# Patient Record
Sex: Male | Born: 1960 | Race: White | Hispanic: No | Marital: Married | Smoking: Never smoker
Health system: Southern US, Community
[De-identification: ages and names within clinical notes are randomized; demographics above are authoritative.]

## PROBLEM LIST (undated history)

## (undated) DIAGNOSIS — T4145XA Adverse effect of unspecified anesthetic, initial encounter: Secondary | ICD-10-CM

## (undated) DIAGNOSIS — Z923 Personal history of irradiation: Secondary | ICD-10-CM

## (undated) DIAGNOSIS — T8859XA Other complications of anesthesia, initial encounter: Secondary | ICD-10-CM

## (undated) DIAGNOSIS — S40029A Contusion of unspecified upper arm, initial encounter: Secondary | ICD-10-CM

## (undated) DIAGNOSIS — C801 Malignant (primary) neoplasm, unspecified: Secondary | ICD-10-CM

## (undated) HISTORY — PX: TONSILLECTOMY: SUR1361

## (undated) HISTORY — PX: ANKLE SURGERY: SHX546

## (undated) HISTORY — PX: KNEE ARTHROSCOPY W/ ACL RECONSTRUCTION: SHX1858

## (undated) HISTORY — DX: Personal history of irradiation: Z92.3

## (undated) HISTORY — PX: HERNIA REPAIR: SHX51

---

## 2015-07-22 ENCOUNTER — Ambulatory Visit
Admission: RE | Admit: 2015-07-22 | Discharge: 2015-07-22 | Disposition: A | Discharge status: Home / Self Care | Payer: PRIVATE HEALTH INSURANCE | Source: Ambulatory Visit | Attending: Surgery | Admitting: Surgery

## 2015-07-22 ENCOUNTER — Other Ambulatory Visit: Payer: Self-pay | Admitting: Surgery

## 2015-07-22 DIAGNOSIS — R222 Localized swelling, mass and lump, trunk: Secondary | ICD-10-CM

## 2015-07-22 MED ORDER — IOPAMIDOL (ISOVUE-300) INJECTION 61%
75.0000 mL | Freq: Once | INTRAVENOUS | Status: AC | PRN
  Administered 2015-07-22: 75 mL via INTRAVENOUS

## 2015-07-25 ENCOUNTER — Other Ambulatory Visit: Payer: Self-pay | Admitting: Surgery

## 2015-07-25 DIAGNOSIS — R590 Localized enlarged lymph nodes: Secondary | ICD-10-CM

## 2015-07-27 ENCOUNTER — Other Ambulatory Visit: Payer: Self-pay | Admitting: Surgery

## 2015-07-27 DIAGNOSIS — N63 Unspecified lump in unspecified breast: Secondary | ICD-10-CM

## 2015-07-28 ENCOUNTER — Other Ambulatory Visit: Payer: Self-pay | Admitting: Radiology

## 2015-07-29 ENCOUNTER — Ambulatory Visit (HOSPITAL_COMMUNITY)
Admission: RE | Admit: 2015-07-29 | Discharge: 2015-07-29 | Disposition: A | Discharge status: Home / Self Care | Payer: PRIVATE HEALTH INSURANCE | Source: Ambulatory Visit | Attending: Surgery | Admitting: Surgery

## 2015-07-29 ENCOUNTER — Encounter (HOSPITAL_COMMUNITY): Payer: Self-pay

## 2015-07-29 DIAGNOSIS — R590 Localized enlarged lymph nodes: Secondary | ICD-10-CM | POA: Insufficient documentation

## 2015-07-29 LAB — CBC
HEMATOCRIT: 43.3 % (ref 39.0–52.0)
Hemoglobin: 14.9 g/dL (ref 13.0–17.0)
MCH: 29.4 pg (ref 26.0–34.0)
MCHC: 34.4 g/dL (ref 30.0–36.0)
MCV: 85.4 fL (ref 78.0–100.0)
Platelets: 170 10*3/uL (ref 150–400)
RBC: 5.07 MIL/uL (ref 4.22–5.81)
RDW: 13 % (ref 11.5–15.5)
WBC: 5.6 10*3/uL (ref 4.0–10.5)

## 2015-07-29 LAB — APTT: aPTT: 27 seconds (ref 24–37)

## 2015-07-29 LAB — PROTIME-INR
INR: 1.12 (ref 0.00–1.49)
Prothrombin Time: 14.2 seconds (ref 11.6–15.2)

## 2015-07-29 MED ORDER — MIDAZOLAM HCL 2 MG/2ML IJ SOLN
INTRAMUSCULAR | Status: AC | PRN
  Administered 2015-07-29 (×4): 1 mg via INTRAVENOUS

## 2015-07-29 MED ORDER — MIDAZOLAM HCL 5 MG/5ML IJ SOLN
INTRAMUSCULAR | Status: AC | PRN
  Administered 2015-07-29: 1 mg via INTRAVENOUS

## 2015-07-29 MED ORDER — FENTANYL CITRATE (PF) 100 MCG/2ML IJ SOLN
INTRAMUSCULAR | Status: AC | PRN
  Administered 2015-07-29: 25 ug via INTRAVENOUS
  Administered 2015-07-29: 50 ug via INTRAVENOUS

## 2015-07-29 MED ORDER — MIDAZOLAM HCL 2 MG/2ML IJ SOLN
INTRAMUSCULAR | Status: AC
  Filled 2015-07-29: qty 6

## 2015-07-29 MED ORDER — SODIUM CHLORIDE 0.9 % IV SOLN
INTRAVENOUS | Status: DC
  Administered 2015-07-29: 09:00:00 via INTRAVENOUS

## 2015-07-29 MED ORDER — FENTANYL CITRATE (PF) 100 MCG/2ML IJ SOLN
INTRAMUSCULAR | Status: AC
  Filled 2015-07-29: qty 4

## 2015-07-29 NOTE — Procedures (Signed)
Technically successful US guided biopsy of dominant right axillary lymph node.   EBL: Minimal   No immediate complications.   Ronny Bacon, MD Pager #: [phone]

## 2015-07-29 NOTE — Discharge Instructions (Signed)
Needle Biopsy, Care After °These instructions give you information about caring for yourself after your procedure. Your doctor may also give you more specific instructions. Call your doctor if you have any problems or questions after your procedure. °HOME CARE °· Rest as told by your doctor. °· Take medicines only as told by your doctor. °· There are many different ways to close and cover the biopsy site, including stitches (sutures), skin glue, and adhesive strips. Follow instructions from your doctor about: °· How to take care of your biopsy site. °· When and how you should change your bandage (dressing). °· When you should remove your dressing. °· Removing whatever was used to close your biopsy site. °· Check your biopsy site every day for signs of infection. Watch for: °· Redness, swelling, or pain. °· Fluid, blood, or pus. °GET HELP IF: °· You have a fever. °· You have redness, swelling, or pain at the biopsy site, and it lasts longer than a few days. °· You have fluid, blood, or pus coming from the biopsy site. °· You feel sick to your stomach (nauseous). °· You throw up (vomit). °GET HELP RIGHT AWAY IF: °· You are short of breath. °· You have trouble breathing. °· Your chest hurts. °· You feel dizzy or you pass out (faint). °· You have bleeding that does not stop with pressure or a bandage. °· You cough up blood. °· Your belly (abdomen) hurts. °  °This information is not intended to replace advice given to you by your health care provider. Make sure you discuss any questions you have with your health care provider. °  °Document Released: 04/19/2008 Document Revised: 09/21/2014 Document Reviewed: 05/03/2014 °Elsevier Interactive Patient Education ©2016 [employer] °Moderate Conscious Sedation, Adult °Sedation is the use of medicines to promote relaxation and relieve discomfort and anxiety. Moderate conscious sedation is a type of sedation. Under moderate conscious sedation you are less alert than normal but  are still able to respond to instructions or stimulation. Moderate conscious sedation is used during short medical and dental procedures. It is milder than deep sedation or general anesthesia and allows you to return to your regular activities sooner. °LET YOUR HEALTH CARE PROVIDER KNOW ABOUT:  °· Any allergies you have. °· All medicines you are taking, including vitamins, herbs, eye drops, creams, and over-the-counter medicines. °· Use of steroids (by mouth or creams). °· Previous problems you or members of your family have had with the use of anesthetics. °· Any blood disorders you have. °· Previous surgeries you have had. °· Medical conditions you have. °· Possibility of pregnancy, if this applies. °· Use of cigarettes, alcohol, or illegal drugs. °RISKS AND COMPLICATIONS °Generally, this is a safe procedure. However, as with any procedure, problems can occur. Possible problems include: °· Oversedation. °· Trouble breathing on your own. You may need to have a breathing tube until you are awake and breathing on your own. °· Allergic reaction to any of the medicines used for the procedure. °BEFORE THE PROCEDURE °· You may have blood tests done. These tests can help show how well your kidneys and liver are working. They can also show how well your blood clots. °· A physical exam will be done.   °· Only take medicines as directed by your health care provider. You may need to stop taking medicines (such as blood thinners, aspirin, or nonsteroidal anti-inflammatory drugs) before the procedure.   °· Do not eat or drink at least 6 hours before the procedure or as directed by   your health care provider. °· Arrange for a responsible adult, family member, or friend to take you home after the procedure. He or she should stay with you for at least 24 hours after the procedure, until the medicine has worn off. °PROCEDURE  °· An intravenous (IV) catheter will be inserted into one of your veins. Medicine will be able to flow  directly into your body through this catheter. You may be given medicine through this tube to help prevent pain and help you relax. °· The medical or dental procedure will be done. °AFTER THE PROCEDURE °· You will stay in a recovery area until the medicine has worn off. Your blood pressure and pulse will be checked.   °·  Depending on the procedure you had, you may be allowed to go home when you can tolerate liquids and your pain is under control. °  °This information is not intended to replace advice given to you by your health care provider. Make sure you discuss any questions you have with your health care provider. °  °Document Released: 01/30/2001 Document Revised: 05/28/2014 Document Reviewed: 01/12/2013 °Elsevier Interactive Patient Education ©2016 [employer] ° °Moderate Conscious Sedation, Adult, Care After °Refer to this sheet in the next few weeks. These instructions provide you with information on caring for yourself after your procedure. Your health care provider may also give you more specific instructions. Your treatment has been planned according to current medical practices, but problems sometimes occur. Call your health care provider if you have any problems or questions after your procedure. °WHAT TO EXPECT AFTER THE PROCEDURE  °After your procedure: °· You may feel sleepy, clumsy, and have poor balance for several hours. °· Vomiting may occur if you eat too soon after the procedure. °HOME CARE INSTRUCTIONS °· Do not participate in any activities where you could become injured for at least 24 hours. Do not: °¨ Drive. °¨ Swim. °¨ Ride a bicycle. °¨ Operate heavy machinery. °¨ Cook. °¨ Use power tools. °¨ Climb ladders. °¨ Work from a high place. °· Do not make important decisions or sign legal documents until you are improved. °· If you vomit, drink water, juice, or soup when you can drink without vomiting. Make sure you have little or no nausea before eating solid foods. °· Only take  over-the-counter or prescription medicines for pain, discomfort, or fever as directed by your health care provider. °· Make sure you and your family fully understand everything about the medicines given to you, including what side effects may occur. °· You should not drink alcohol, take sleeping pills, or take medicines that cause drowsiness for at least 24 hours. °· If you smoke, do not smoke without supervision. °· If you are feeling better, you may resume normal activities 24 hours after you were sedated. °· Keep all appointments with your health care provider. °SEEK MEDICAL CARE IF: °· Your skin is pale or bluish in color. °· You continue to feel nauseous or vomit. °· Your pain is getting worse and is not helped by medicine. °· You have bleeding or swelling. °· You are still sleepy or feeling clumsy after 24 hours. °SEEK IMMEDIATE MEDICAL CARE IF: °· You develop a rash. °· You have difficulty breathing. °· You develop any type of allergic problem. °· You have a fever. °MAKE SURE YOU: °· Understand these instructions. °· Will watch your condition. °· Will get help right away if you are not doing well or get worse. °  °This information is not intended to   replace advice given to you by your health care provider. Make sure you discuss any questions you have with your health care provider. °  °Document Released: 02/25/2013 Document Revised: 05/28/2014 Document Reviewed: 02/25/2013 °Elsevier Interactive Patient Education ©2016 [employer] ° ° °

## 2015-07-29 NOTE — H&P (Signed)
Chief Complaint: Patient was seen in consultation today for ultrasound-guided right axillary mass/node biopsy  Referring Physician(s): Tsuei,Matthew  Supervising Physician: Sandi Mariscal  History of Present Illness: Jonathan Hayes is a 55 y.o. male with approximately one month history of enlarging right axillary/right upper anterior lateral chest mass of unknown etiology. Recent CT scan of the chest on 07/22/15 revealed bulky lymphadenopathy in the right subpectoral region right supraclavicular space and right axilla. Findings are concerning for neoplasm/metastatic disease/lymphoma. He presents today for ultrasound-guided right axillary mass/node biopsy.  History reviewed. No pertinent past medical history.  Past Surgical History  Procedure Laterality Date  . Hernia repair      as a baby  . Tonsillectomy      age 20  . Knee arthroscopy w/ acl reconstruction      rt and lt knee    Allergies: Review of patient's allergies indicates not on file.  Medications: Prior to Admission medications   Not on File     History reviewed. No pertinent family history.  Social History   Social History  . Marital Status: Married    Spouse Name: N/A  . Number of Children: N/A  . Years of Education: N/A   Social History Main Topics  . Smoking status: Never Smoker   . Smokeless tobacco: None  . Alcohol Use: Yes     Comment: mixed drink maybe once a year  . Drug Use: No  . Sexual Activity: Not Asked   Other Topics Concern  . None   Social History Narrative  . None      Review of Systems  Constitutional: Negative for fever and chills.  Respiratory: Negative for cough and shortness of breath.   Cardiovascular:       Intermittent right upper chest discomfort  Gastrointestinal: Negative for nausea, vomiting, abdominal pain and blood in stool.  Genitourinary: Negative for dysuria and hematuria.  Musculoskeletal: Positive for neck pain. Negative for back pain.       Right upper  extremity swelling  Neurological:       Occasional headaches    Vital Signs: Blood pressure 142/90, heart rate 73, respirations 18, oxygen saturation 98% room air   Physical Exam  Constitutional: He is oriented to person, place, and time. He appears well-developed and well-nourished.  Cardiovascular: Normal rate and regular rhythm.   Pulmonary/Chest: Effort normal and breath sounds normal.  Abdominal: Soft. Bowel sounds are normal. There is no tenderness.  Musculoskeletal:  Right upper extremity edema noted along with prominent, mildly tender right axillary/right anterior lateral chest region nodal mass  Neurological: He is alert and oriented to person, place, and time.    Mallampati Score:     Imaging: Ct Chest W Contrast  07/22/2015  CLINICAL DATA:  One month history of right axillary mass. EXAM: CT CHEST WITH CONTRAST TECHNIQUE: Multidetector CT imaging of the chest was performed during intravenous contrast administration. CONTRAST: 43mL ISOVUE-300 IOPAMIDOL (ISOVUE-300) INJECTION 61% COMPARISON:  None. FINDINGS: Mediastinum / Lymph Nodes: No mediastinal lymphadenopathy. There is no hilar lymphadenopathy. The heart size is normal. No pericardial effusion. The esophagus has normal imaging features. Bulky necrotic lymphadenopathy is seen in the right subpectoral region and right axilla. A dominant right axillary lymph node measures 6.4 x 4.2 x 8.6 cm. Other bulky lymph nodes are seen in the axilla, subpectoral space, and right supraclavicular region. No contralateral axillary lymphadenopathy. Lungs / Pleura: No focal airspace consolidation. No pulmonary edema or pleural effusion. 5 mm left lower lobe  pulmonary nodule is visualized on image 31 of series 5. 4 mm right middle lobe pulmonary nodule seen on image 38. Upper Abdomen: Scattered hypo attenuating lesions in the liver parenchyma cannot be definitively characterize but are likely cysts. Adrenal glands are normal in appearance. No  evidence for lymphadenopathy in the upper abdomen. MSK / Soft Tissues: Bone windows reveal no worrisome lytic or sclerotic osseous lesions. IMPRESSION: Bulky lymphadenopathy in the right subpectoral region, as right supraclavicular space, and most prominently in the right axilla. Imaging features are concerning for neoplasm and metastatic disease or lymphoma could have this appearance. Electronically Signed   By: Misty Stanley M.D.   On: 07/22/2015 14:11    Labs:  CBC:  Recent Labs  07/29/15 0830  WBC 5.6  HGB 14.9  HCT 43.3  PLT 170    COAGS:  Recent Labs  07/29/15 0830  INR 1.12  APTT 27    BMP: No results for input(s): NA, K, CL, CO2, GLUCOSE, BUN, CALCIUM, CREATININE, GFRNONAA, GFRAA in the last 8760 hours.  Invalid input(s): CMP  LIVER FUNCTION TESTS: No results for input(s): BILITOT, AST, ALT, ALKPHOS, PROT, ALBUMIN in the last 8760 hours.  TUMOR MARKERS: No results for input(s): AFPTM, CEA, CA199, CHROMGRNA in the last 8760 hours.  Assessment and Plan: 55 y.o. male with approximately one month history of enlarging right axillary/right upper anterior lateral chest mass of unknown etiology. Recent CT scan of the chest on 07/22/15 revealed bulky lymphadenopathy in the right subpectoral region right supraclavicular space and right axilla. Findings are concerning for neoplasm/metastatic disease/lymphoma. He presents today for ultrasound-guided right axillary mass/node biopsy.Risks and benefits discussed with the patient/wife including, but not limited to bleeding, infection, damage to adjacent structures or low yield requiring additional tests.All of the patient's questions were answered, patient is agreeable to proceed.Consent signed and in chart.      Thank you for this interesting consult.  I greatly enjoyed meeting Kekai Lemanski and look forward to participating in their care.  A copy of this report was sent to the requesting provider on this date.  Electronically  Signed: D. Rowe Robert 07/29/2015, 9:29 AM   I spent a total of 15 minutes    in face to face in clinical consultation, greater than 50% of which was counseling/coordinating care for ultrasound-guided right axillary lymph node biopsy

## 2015-08-02 ENCOUNTER — Other Ambulatory Visit: Payer: PRIVATE HEALTH INSURANCE

## 2015-08-02 ENCOUNTER — Inpatient Hospital Stay: Admission: RE | Admit: 2015-08-02 | Payer: PRIVATE HEALTH INSURANCE | Source: Ambulatory Visit

## 2015-08-04 ENCOUNTER — Other Ambulatory Visit: Payer: Self-pay | Admitting: Radiation Oncology

## 2015-08-04 DIAGNOSIS — R222 Localized swelling, mass and lump, trunk: Secondary | ICD-10-CM

## 2015-08-04 DIAGNOSIS — C4359 Malignant melanoma of other part of trunk: Secondary | ICD-10-CM | POA: Insufficient documentation

## 2015-08-05 ENCOUNTER — Telehealth: Payer: Self-pay | Admitting: *Deleted

## 2015-08-05 ENCOUNTER — Other Ambulatory Visit: Payer: PRIVATE HEALTH INSURANCE

## 2015-08-05 NOTE — Telephone Encounter (Signed)
CALLED PATIENT TO INFORM OF MRI FOR 08-05-15  @ [facility] AND HIS PET FOR 08-15-15 @ [facility], SPOKE WITH PATIENT AND HE IS AWARE OF THESE TESTS AND HE IS GOOD WITH THEM.

## 2015-08-06 ENCOUNTER — Ambulatory Visit
Admission: RE | Admit: 2015-08-06 | Discharge: 2015-08-06 | Disposition: A | Discharge status: Home / Self Care | Payer: PRIVATE HEALTH INSURANCE | Source: Ambulatory Visit | Attending: Radiation Oncology | Admitting: Radiation Oncology

## 2015-08-06 DIAGNOSIS — C4359 Malignant melanoma of other part of trunk: Secondary | ICD-10-CM

## 2015-08-06 MED ORDER — GADOBENATE DIMEGLUMINE 529 MG/ML IV SOLN
20.0000 mL | Freq: Once | INTRAVENOUS | Status: AC | PRN
  Administered 2015-08-06: 20 mL via INTRAVENOUS

## 2015-08-08 ENCOUNTER — Encounter: Payer: Self-pay | Admitting: Radiation Oncology

## 2015-08-08 ENCOUNTER — Ambulatory Visit
Admission: RE | Admit: 2015-08-08 | Discharge: 2015-08-08 | Disposition: A | Discharge status: Home / Self Care | Payer: PRIVATE HEALTH INSURANCE | Source: Ambulatory Visit | Attending: Radiation Oncology | Admitting: Radiation Oncology

## 2015-08-08 VITALS — BP 140/78 | HR 68 | Temp 97.7°F | Resp 16 | Ht 73.5 in | Wt 237.0 lb

## 2015-08-08 DIAGNOSIS — R911 Solitary pulmonary nodule: Secondary | ICD-10-CM | POA: Insufficient documentation

## 2015-08-08 DIAGNOSIS — C761 Malignant neoplasm of thorax: Secondary | ICD-10-CM | POA: Insufficient documentation

## 2015-08-08 DIAGNOSIS — C4359 Malignant melanoma of other part of trunk: Secondary | ICD-10-CM

## 2015-08-08 NOTE — Progress Notes (Signed)
Please see the Nurse Progress Note in the MD Initial Consult Encounter for this patient. 

## 2015-08-08 NOTE — Progress Notes (Signed)
Histology and Location of Primary Cancer: malignant melanoma  Location(s) of Symptomatic tumor(s): enlarging right axillary/right upper anterior lateral chest mass that presented in December/January.  Pathology:  07/29/15 Lymph node, needle/core biopsy, right axillary Metastatic malignant melanoma  Past/Anticipated chemotherapy by medical oncology, if any:   Patient's main complaints related to symptomatic tumor(s) are: swelling.  He reports pain when he turns over on his right side at night.  He also reports having tension headache in the back of the right side of his head in the mornings.  He also reports pain in his right arm.  Pain on a scale of 0-10 is: 2/10.  He is not taking any pain medication.  SAFETY ISSUES:  Prior radiation? no  Pacemaker/ICD? no  Possible current pregnancy? no  Is the patient on methotrexate? no  Additional Complaints / other details:  PET scan scheduled for 08/15/15.  Patient is here with his wife and his brother in law.  BP 140/78 mmHg  Pulse 68  Temp(Src) 97.7 F (36.5 C) (Oral)  Resp 16  Ht 6' 1.5" (1.867 m)  Wt 237 lb (107.502 kg)  BMI 30.84 kg/m2

## 2015-08-08 NOTE — Progress Notes (Addendum)
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 Stage pending PET scan  HISTORY OF PRESENT ILLNESS::Jonathan Hayes is a 55 y.o. male who is seen out courtesy of Dr. Georgette Dover for an opinion concerning radiation therapy as part of the management of patient's recently diagnosed melanoma presenting in the right axillary region. Patient reports rapidly enlarging mass in the right axilla, he first noticed some time in January. This has enlarged significantly over the past few weeks.Marland Kitchen He was seen by Gen. surgery and a CT scan was performed which shows extensive lymphadenopathy within the right axillary and subpectoral region. The patient proceeded to undergo biopsy of this lymph node mass which returned malignant melanoma. The patient is seen in radiation oncology to expedite his workup and to formulate a treatment plan.  Medical oncology consultation in the near future.  PREVIOUS RADIATION THERAPY: No  PAST MEDICAL HISTORY:  has no past medical history on file.    PAST SURGICAL HISTORY: Past Surgical History  Procedure Laterality Date  . Hernia repair      as a baby  . Tonsillectomy      age 34  . Knee arthroscopy w/ acl reconstruction      rt and lt knee    FAMILY HISTORY: family history includes Breast cancer in his mother; Prostate cancer in his father. Father died in his early 23F from complications related to metastatic prostate cancer. Mother is alive and doing well no signs of recurrence  SOCIAL HISTORY:  reports that he has never smoked. He has never used smokeless tobacco. He reports that he drinks alcohol. He reports that he does not use illicit drugs. Accompanied by father-in-law and wife on  evaluation today. He reports a very busy lifestyle with coaching volleyball, church activities, boating and scuba diving events, makes frequent trips to [place] [place] for fishing and scuba diving. 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  MEDICATIONS:  Current Outpatient Prescriptions  Medication Sig Dispense Refill  . Multiple Vitamins-Minerals (MULTIVITAMIN GUMMIES ADULT PO) Take by mouth.     No current facility-administered medications for this encounter.    REVIEW OF SYSTEMS:  A 15 point review of systems is documented in the electronic medical record. This was obtained by the nursing staff. However, I reviewed this with the patient to discuss relevant findings and make appropriate changes.  He reports some discomfort/pain in the right axillary area but does not take any pain medication for this issue. He has noticed swelling in his right arm and hand but has not limited his activity.  He does notice some pain in his right occipital area and right neck area oftentimes upon awakening but clears up during the course of the day. Patient denies any cough or breathing problems. He has noticed fatigue over the past several weeks. His appetite is good. He denies any visual problems or double vision. The patient denies any other areas of discomfort or pain.  He reports no unusual skin lesions.   PHYSICAL EXAM:  height is 6' 1.5" (1.867 m) and weight is 237 lb (107.502 kg). His oral temperature is 97.7 F (36.5 C). His blood pressure  is 140/78 and his pulse is 68. His respiration is 16.   General: Alert and oriented, in no acute distress HEENT: Head is normocephalic. Extraocular movements are intact. Oropharynx is clear. Neck: Neck is supple, no palpable cervical or supraclavicular lymphadenopathy. Heart: Regular in rate and rhythm with no murmurs, rubs, or gallops. Chest: Clear to  auscultation bilaterally, with no rhonchi, wheezes, or rales. Swelling noted in the upper outer right chest region Abdomen: Soft, nontender, nondistended, with no rigidity or guarding. Extremities: Patient has fairly significant edema of his right arm and hand Lymphatics: Large lymph  nodal mass in the right axillary region estimated to be approximately 10 x 11 cm with swelling going down into the right pectoral region Skin: No concerning lesions, comprehensive survey of the patient's skin was performed today. He does have a scar in his left anterior mid chest proximally 3 x 4 cm which were patient reports was removed by his primary care physician and turned out to be no evidence of malignancy. Musculoskeletal: symmetric strength and muscle tone throughout. Neurologic: Cranial nerves II through XII are grossly intact. No obvious focalities. Speech is fluent. Coordination is intact. Psychiatric: Judgment and insight are intact. Affect is appropriate.     ECOG = 1  LABORATORY DATA:  Lab Results  Component Value Date   WBC 5.6 07/29/2015   HGB 14.9 07/29/2015   HCT 43.3 07/29/2015   MCV 85.4 07/29/2015   PLT 170 07/29/2015   No results found for: NA, K, CL, CO2, GLUCOSE, CREATININE, CALCIUM    RADIOGRAPHY: Ct Chest W Contrast  07/22/2015  CLINICAL DATA:  One month history of right axillary mass. EXAM: CT CHEST WITH CONTRAST TECHNIQUE: Multidetector CT imaging of the chest was performed during intravenous contrast administration. CONTRAST: 78m ISOVUE-300 IOPAMIDOL (ISOVUE-300) INJECTION 61% COMPARISON:  None. FINDINGS: Mediastinum / Lymph Nodes: No mediastinal lymphadenopathy. There is no hilar lymphadenopathy. The heart size is normal. No pericardial effusion. The esophagus has normal imaging features. Bulky necrotic lymphadenopathy is seen in the right subpectoral region and right axilla. A dominant right axillary lymph node measures 6.4 x 4.2 x 8.6 cm. Other bulky lymph nodes are seen in  the axilla, subpectoral space, and right supraclavicular region. No contralateral axillary lymphadenopathy. Lungs / Pleura: No focal airspace consolidation. No pulmonary edema or pleural effusion. 5 mm left lower lobe pulmonary nodule is visualized on image 31 of series 5. 4 mm right middle lobe pulmonary nodule seen on image 38. Upper Abdomen: Scattered hypo attenuating lesions in the liver parenchyma cannot be definitively characterize but are likely cysts. Adrenal glands are normal in appearance. No evidence for lymphadenopathy in the upper abdomen. MSK / Soft Tissues: Bone windows reveal no worrisome lytic or sclerotic osseous lesions. IMPRESSION: Bulky lymphadenopathy in the right subpectoral region, as right supraclavicular space, and most prominently in the right axilla. Imaging features are concerning for neoplasm and metastatic disease or lymphoma could have this appearance. Electronically Signed   By: EMisty StanleyM.D.   On: 07/22/2015 14:11   Mr BJeri CosWGDContrast  08/07/2015  CLINICAL DATA:  Malignant melanoma.  Staging EXAM: MRI HEAD WITHOUT AND WITH CONTRAST TECHNIQUE: Multiplanar, multiecho pulse sequences of the brain and surrounding structures were obtained without and with intravenous contrast. CONTRAST:  261mMULTIHANCE GADOBENATE DIMEGLUMINE 529 MG/ML IV SOLN COMPARISON:  None. FINDINGS: Ventricle size normal.  Cerebral volume normal. Negative for acute infarct. Small white matter hyperintensities in the frontal white matter bilaterally do not enhance. These are nonspecific  but likely related to chronic microvascular ischemia or migraine headaches. Negative for intracranial hemorrhage. No susceptibility identified. Negative for mass or edema Postcontrast imaging reveals no enhancing mass lesion. Normal vascular enhancement. No skull lesion identified. Normal skull base.  Normal orbit.  Normal pituitary. IMPRESSION: Negative for metastatic disease. Mild chronic white matter changes.  Electronically Signed   By: Franchot Gallo M.D.   On: 08/07/2015 10:00   [place] Core Biopsy  07/29/2015  INDICATION: Right axillary lymphadenopathy of uncertain etiology. Please perform ultrasound-guided right axillary lymph node biopsy for tissue diagnostic purposes. EXAM: ULTRASOUND CORE BIOPSY COMPARISON:  Chest CT - 07/22/2015 MEDICATIONS: None ANESTHESIA/SEDATION: Moderate (conscious) sedation was employed during this procedure. A total of Versed 4 mg and Fentanyl 75 mcg was administered intravenously. Moderate Sedation Time: 13 minutes. The patient's level of consciousness and vital signs were monitored continuously by radiology nursing throughout the procedure under my direct supervision. COMPLICATIONS: None immediate. TECHNIQUE: Informed written consent was obtained from the patient after a discussion of the risks, benefits and alternatives to treatment. Questions regarding the procedure were encouraged and answered. Initial ultrasound scanning demonstrated multiple enlarged right axillary lymph nodes with dominant node measuring approximately 5.2 x 4.0 cm (image 3). An ultrasound image was saved for documentation purposes. The procedure was planned. A timeout was performed prior to the initiation of the procedure. The operative was prepped and draped in the usual sterile fashion, and a sterile drape was applied covering the operative field. A timeout was performed prior to the initiation of the procedure. Local anesthesia was provided with 1% lidocaine with epinephrine. Under direct ultrasound guidance, an 18 gauge core needle device was utilized to obtain to obtain 6 core needle biopsied of the dominant right axillary lymph node. The samples were placed in saline and submitted to pathology. The needle was removed and hemostasis was achieved with manual compression. Post procedure scan was negative for significant hematoma. A dressing was placed. The patient tolerated the procedure well without immediate  postprocedural complication. IMPRESSION: Technically successful ultrasound guided right axillary lymph node biopsy. Electronically Signed   By: Sandi Mariscal M.D.   On: 07/29/2015 12:28      IMPRESSION: Malignant melanoma presenting with extensive lymphadenopathy within the right axillary region. The patient has undergone a MRI for staging purposes which shows no evidence metastasis to the brain. He will undergo a PET scan March 27 or sooner if an opening occurs.  PLAN: The patient will proceed with medical oncology evaluation in the near future. If his PET scan shows disease isolated to the right axillary area  then he will likely proceed with axillary dissection followed by possible radiation therapy and interferon. The patient will likely have referral to tertiary care center for potential protocols involving malignant melanoma.  I have requested that the tissue sample be evaluated for the BRAF mutations.      ------------------------------------------------  Blair Promise, PhD, MD

## 2015-08-09 ENCOUNTER — Other Ambulatory Visit (HOSPITAL_COMMUNITY)
Admission: RE | Admit: 2015-08-09 | Discharge: 2015-08-09 | Disposition: A | Discharge status: Home / Self Care | Payer: PRIVATE HEALTH INSURANCE | Source: Ambulatory Visit | Attending: Hematology & Oncology | Admitting: Hematology & Oncology

## 2015-08-09 DIAGNOSIS — C439 Malignant melanoma of skin, unspecified: Secondary | ICD-10-CM | POA: Diagnosis not present

## 2015-08-11 ENCOUNTER — Telehealth: Payer: Self-pay | Admitting: Oncology

## 2015-08-11 NOTE — Telephone Encounter (Signed)
Called Jonathan Hayes back and advised him that Dr. Sondra Hayes would like him to have the PET scan on Monday before he is referred to a surgeon.  Also advised him that we are checking on his referral to Dr. Marin Hayes.  Jonathan Hayes verbalized agreement and stated that he is worried about waiting for surgery because he does not want the cancer to spread.  He said that he does not need pain medication at this time.

## 2015-08-11 NOTE — Telephone Encounter (Signed)
Jonathan Hayes called and asked what his next steps are.  He is not sure if he needs to see Dr. Georgette Dover again for surgery or go to the melanoma specialist.  He is also wondering when he will see medical oncology.  He said the swelling in his arm is worse and interfering with his activities of daily living.  He would like to have the surgery scheduled as soon as possible.

## 2015-08-15 ENCOUNTER — Ambulatory Visit: Payer: Self-pay | Admitting: Surgery

## 2015-08-15 ENCOUNTER — Ambulatory Visit (HOSPITAL_COMMUNITY)
Admission: RE | Admit: 2015-08-15 | Discharge: 2015-08-15 | Disposition: A | Discharge status: Home / Self Care | Payer: PRIVATE HEALTH INSURANCE | Source: Ambulatory Visit | Attending: Radiation Oncology | Admitting: Radiation Oncology

## 2015-08-15 DIAGNOSIS — I700 Atherosclerosis of aorta: Secondary | ICD-10-CM | POA: Diagnosis not present

## 2015-08-15 DIAGNOSIS — C4359 Malignant melanoma of other part of trunk: Secondary | ICD-10-CM | POA: Diagnosis present

## 2015-08-15 DIAGNOSIS — K4090 Unilateral inguinal hernia, without obstruction or gangrene, not specified as recurrent: Secondary | ICD-10-CM | POA: Insufficient documentation

## 2015-08-15 DIAGNOSIS — R938 Abnormal findings on diagnostic imaging of other specified body structures: Secondary | ICD-10-CM | POA: Diagnosis not present

## 2015-08-15 LAB — GLUCOSE, CAPILLARY: GLUCOSE-CAPILLARY: 91 mg/dL (ref 65–99)

## 2015-08-15 MED ORDER — FLUDEOXYGLUCOSE F - 18 (FDG) INJECTION
11.7700 | Freq: Once | INTRAVENOUS | Status: AC | PRN
  Administered 2015-08-15: 11.77 via INTRAVENOUS

## 2015-08-15 NOTE — H&P (Signed)
History of Present Illness The patient is a 55 year old male who presents with a complaint of Mass. Referred by Dr. Reeves Dam for evaluation of right chest wall mass  This is a 55 year old male in good health who is fairly active who presents with about a 3 month history of a fairly rapidly enlarging right chest wall mass. His wife first palpated a 34 months ago and it was only a couple centimeters across. Now it is over 10 cm across. It causes significant discomfort when he tries to lay down because of the pressure on his chest. When he is up on his feet with normal daily activity he has minimal discomfort associated with this. They're concerned because of the rapid enlargement of this area. He has not had any imaging yet of this area.  He denies any chest pain or shortness of breath.  I obtained a CT chest on 07/22/15 which showed bulky lymphadenopathy in the right subpectoral region, right supraclavicular space and right axilla.  The largest axillary node measured 6.4 x 4.2 x 8.6 cm.  He underwent core biopsy on 07/29/15 that showed metastatic melanoma.  No primary has been identified at this time.  MRI brain was negative and PET scan showed only the right axillary activity.   CLINICAL DATA: One month history of right axillary mass.  EXAM: CT CHEST WITH CONTRAST  TECHNIQUE: Multidetector CT imaging of the chest was performed during intravenous contrast administration.  CONTRAST: 5mL ISOVUE-300 IOPAMIDOL (ISOVUE-300) INJECTION 61%  COMPARISON: None.  FINDINGS: Mediastinum / Lymph Nodes: No mediastinal lymphadenopathy. There is no hilar lymphadenopathy. The heart size is normal. No pericardial effusion. The esophagus has normal imaging features.  Bulky necrotic lymphadenopathy is seen in the right subpectoral region and right axilla. A dominant right axillary lymph node measures 6.4 x 4.2 x 8.6 cm. Other bulky lymph nodes are seen in the axilla, subpectoral  space, and right supraclavicular region. No contralateral axillary lymphadenopathy.  Lungs / Pleura: No focal airspace consolidation. No pulmonary edema or pleural effusion. 5 mm left lower lobe pulmonary nodule is visualized on image 31 of series 5. 4 mm right middle lobe pulmonary nodule seen on image 38.  Upper Abdomen: Scattered hypo attenuating lesions in the liver parenchyma cannot be definitively characterize but are likely cysts. Adrenal glands are normal in appearance. No evidence for lymphadenopathy in the upper abdomen.  MSK / Soft Tissues: Bone windows reveal no worrisome lytic or sclerotic osseous lesions.  IMPRESSION: Bulky lymphadenopathy in the right subpectoral region, as right supraclavicular space, and most prominently in the right axilla. Imaging features are concerning for neoplasm and metastatic disease or lymphoma could have this appearance.   Electronically Signed  By: Misty Stanley M.D.  On: 07/22/2015 14:11   CLINICAL DATA: Malignant melanoma. Staging  EXAM: MRI HEAD WITHOUT AND WITH CONTRAST  TECHNIQUE: Multiplanar, multiecho pulse sequences of the brain and surrounding structures were obtained without and with intravenous contrast.  CONTRAST: 20mL MULTIHANCE GADOBENATE DIMEGLUMINE 529 MG/ML IV SOLN  COMPARISON: None.  FINDINGS: Ventricle size normal. Cerebral volume normal.  Negative for acute infarct. Small white matter hyperintensities in the frontal white matter bilaterally do not enhance. These are nonspecific but likely related to chronic microvascular ischemia or migraine headaches.  Negative for intracranial hemorrhage. No susceptibility identified. Negative for mass or edema  Postcontrast imaging reveals no enhancing mass lesion. Normal vascular enhancement. No skull lesion identified.  Normal skull base. Normal orbit. Normal pituitary.  IMPRESSION: Negative for metastatic disease.  Mild chronic  white matter changes.   Electronically Signed  By: Franchot Gallo M.D.  On: 08/07/2015 10:00   CLINICAL DATA: Initial treatment strategy for staging of malignant melanoma of right axilla.  EXAM: NUCLEAR MEDICINE PET WHOLE BODY  TECHNIQUE: 11.8 mCi F-18 FDG was injected intravenously. Full-ring PET imaging was performed from the vertex to the feet after the radiotracer. CT data was obtained and used for attenuation correction and anatomic localization.  FASTING BLOOD GLUCOSE: Value: 91 mg/dl  COMPARISON: Chest CT of 07/22/2015.  FINDINGS: HEAD/NECK  No areas of abnormal hypermetabolism.  CHEST  Hypermetabolism corresponding to right axillary adenopathy. Deep right axillary nodal conglomerate which extends into the which supraclavicular space minimally measures on the order of 5.5 x 3.4 cm and a S.U.V. max of 28.9 on image 80/series 4.  Heterogeneously necrotic more inferior right axillary mass measures 8.2 x 7.5 cm and a S.U.V. max of 29.3, including on image 108 of series 4.  ABDOMEN/PELVIS  No areas of abnormal hypermetabolism.  SKELETON  Low-level heterogeneous hypermetabolism about the right tibialis anterior muscle. No areas of skin hypermetabolism identified.  CT IMAGES PERFORMED FOR ATTENUATION CORRECTION  No cervical adenopathy.  Chest findings deferred to recent diagnostic CT. No acute superimposed process.  Well-circumscribed hypo attenuating left liver lobe lesions are likely cysts. No correlate hypermetabolism. Exam degraded by patient body habitus. Abdominal aortic atherosclerosis. Small fat containing right inguinal hernia. Surgical changes about the distal right femur and proximal right tibia.  IMPRESSION: 1. Hypermetabolism corresponding to right axillary nodal metastasis. No primary skin lesion or other areas of distant metastatic disease identified. 2. Mid low-level hypermetabolism about the right tibialis  anterior muscle with concurrent right lower extremity surgical changes. This hypermetabolism could be postoperative or related to prior musculotendinous strain.   Electronically Signed  By: Abigail Miyamoto M.D.  On: 08/15/2015 09:18  Diagnosis Lymph node, needle/core biopsy, right axillary METASTATIC MALIGNANT MELANOMA Microscopic Comment The neoplasm stains positive for Melan a, HMB45, MITF, s100, negative for cytokeratin AE1/3, ck8/18, ck5/6, p63, p16, TTF-1, Napsin a, ck7, cdx-2, prostein, cd 45, cd30. The morphology and immunostaining pattern support the diagnosis of metastatic melanoma. This case also reviewed by Dr. Lyndon Code and agree. Casimer Lanius MD Pathologist, Electronic Signature (Case signed 08/04/2015) Specimen Gross and Clinical Information Specimen(s) Obtained: Lymph node, needle/core biopsy, right axillary    Other Problems  Gastroesophageal Reflux Disease Hemorrhoids  Past Surgical History  Tonsillectomy  Diagnostic Studies History Colonoscopy 1-5 years ago  Allergies  No Known Drug Allergies03/07/2015  Medication History  Ketoconazole (2% Cream, External) Active. Triamcinolone Acetonide (0.5% Cream, External) Active. Fluconazole (100MG  Tablet, Oral) Active. Medications Reconciled    Review of System General Present- Fatigue. Not Present- Appetite Loss, Chills, Fever, Night Sweats, Weight Gain and Weight Loss. Skin Present- Rash. Not Present- Change in Wart/Mole, Dryness, Hives, Jaundice, New Lesions, Non-Healing Wounds and Ulcer. HEENT Present- Ringing in the Ears and Wears glasses/contact lenses. Not Present- Earache, Hearing Loss, Hoarseness, Nose Bleed, Oral Ulcers, Seasonal Allergies, Sinus Pain, Sore Throat, Visual Disturbances and Yellow Eyes. Breast Present- Breast Mass. Not Present- Breast Pain, Nipple Discharge and Skin Changes. Gastrointestinal Present- Hemorrhoids. Not Present- Abdominal Pain, Bloating, Bloody Stool, Change in  Bowel Habits, Chronic diarrhea, Constipation, Difficulty Swallowing, Excessive gas, Gets full quickly at meals, Indigestion, Nausea, Rectal Pain and Vomiting. Male Genitourinary Not Present- Blood in Urine, Change in Urinary Stream, Frequency, Impotence, Nocturia, Painful Urination, Urgency and Urine Leakage. Musculoskeletal Not Present- Back Pain, Joint Pain, Joint Stiffness, Muscle Pain,  Muscle Weakness and Swelling of Extremities. Neurological Not Present- Decreased Memory, Fainting, Headaches, Numbness, Seizures, Tingling, Tremor, Trouble walking and Weakness. Psychiatric Not Present- Anxiety, Bipolar, Change in Sleep Pattern, Depression, Fearful and Frequent crying. Endocrine Not Present- Cold Intolerance, Excessive Hunger, Hair Changes, Heat Intolerance, Hot flashes and New Diabetes. Hematology Not Present- Easy Bruising, Excessive bleeding, Gland problems, HIV and Persistent Infections.  Vitals  Weight: 234 lb Height: 73in Body Surface Area: 2.3 m Body Mass Index: 30.87 kg/m  Temp.: 30F(Temporal)  Pulse: 79 (Regular)  BP: 128/80 (Sitting, Left Arm, Standard)       Physical Exam  The physical exam findings are as follows: Note:WDWN in NAD Right chest just medial to axilla - 10-12 cm protruding subcutaneous mass; firm; some dilated overlying veins Not mobile No inflammation noted. Lungs CTA B CV - RRR    Assessment & Plan  MASS OF RIGHT CHEST WALL (R22.2) Impression: 10 cm; near axilla; firm Current Plans  Right axillary lymph node dissection.  The surgical procedure has been discussed with the patient.  Potential risks, benefits, alternative treatments, and expected outcomes have been explained.  All of the patient's questions at this time have been answered.  The likelihood of reaching the patient's treatment goal is good.  The patient understand the proposed surgical procedure and wishes to proceed.   Imogene Burn. Georgette Dover, MD, [facility]   General/ Trauma Surgery  08/15/2015 1:04 PM

## 2015-08-16 ENCOUNTER — Ambulatory Visit: Payer: PRIVATE HEALTH INSURANCE

## 2015-08-16 ENCOUNTER — Ambulatory Visit (HOSPITAL_BASED_OUTPATIENT_CLINIC_OR_DEPARTMENT_OTHER): Payer: PRIVATE HEALTH INSURANCE | Admitting: Hematology & Oncology

## 2015-08-16 ENCOUNTER — Other Ambulatory Visit (HOSPITAL_BASED_OUTPATIENT_CLINIC_OR_DEPARTMENT_OTHER): Payer: PRIVATE HEALTH INSURANCE

## 2015-08-16 ENCOUNTER — Encounter: Payer: Self-pay | Admitting: Hematology & Oncology

## 2015-08-16 VITALS — BP 129/75 | HR 71 | Temp 97.9°F | Resp 18 | Ht 73.0 in | Wt 237.0 lb

## 2015-08-16 DIAGNOSIS — C4359 Malignant melanoma of other part of trunk: Secondary | ICD-10-CM

## 2015-08-16 LAB — CBC WITH DIFFERENTIAL (CANCER CENTER ONLY)
BASO#: 0 10*3/uL (ref 0.0–0.2)
BASO%: 0.2 % (ref 0.0–2.0)
EOS ABS: 0.1 10*3/uL (ref 0.0–0.5)
EOS%: 1.9 % (ref 0.0–7.0)
HEMATOCRIT: 42 % (ref 38.7–49.9)
HEMOGLOBIN: 14.9 g/dL (ref 13.0–17.1)
LYMPH#: 0.9 10*3/uL (ref 0.9–3.3)
LYMPH%: 16.1 % (ref 14.0–48.0)
MCH: 29.4 pg (ref 28.0–33.4)
MCHC: 35.5 g/dL (ref 32.0–35.9)
MCV: 83 fL (ref 82–98)
MONO#: 0.5 10*3/uL (ref 0.1–0.9)
MONO%: 8.6 % (ref 0.0–13.0)
NEUT%: 73.2 % (ref 40.0–80.0)
NEUTROS ABS: 4.2 10*3/uL (ref 1.5–6.5)
Platelets: 186 10*3/uL (ref 145–400)
RBC: 5.06 10*6/uL (ref 4.20–5.70)
RDW: 12.9 % (ref 11.1–15.7)
WBC: 5.7 10*3/uL (ref 4.0–10.0)

## 2015-08-16 LAB — CMP (CANCER CENTER ONLY)
ALBUMIN: 3.6 g/dL (ref 3.3–5.5)
ALT(SGPT): 44 U/L (ref 10–47)
AST: 39 U/L — ABNORMAL HIGH (ref 11–38)
Alkaline Phosphatase: 105 U/L — ABNORMAL HIGH (ref 26–84)
BUN, Bld: 9 mg/dL (ref 7–22)
CALCIUM: 9.1 mg/dL (ref 8.0–10.3)
CHLORIDE: 103 meq/L (ref 98–108)
CO2: 27 meq/L (ref 18–33)
Creat: 0.8 mg/dl (ref 0.6–1.2)
Glucose, Bld: 98 mg/dL (ref 73–118)
POTASSIUM: 3.9 meq/L (ref 3.3–4.7)
Sodium: 140 mEq/L (ref 128–145)
Total Bilirubin: 0.9 mg/dl (ref 0.20–1.60)
Total Protein: 6.9 g/dL (ref 6.4–8.1)

## 2015-08-16 LAB — LACTATE DEHYDROGENASE: LDH: 738 U/L — ABNORMAL HIGH (ref 125–245)

## 2015-08-16 NOTE — Progress Notes (Signed)
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 OTHER    New Patient  : I have melanoma under my right arm.  HPI: Jonathan Hayes is a very nice 55 year old white male. 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 He does scuba diving and free diving to [employer].  Probably back in December, he noted a small nodule in the right axilla. This would do not bother him. There is no pain. There is no arm swelling.  Unfortunately, this nodule increased in size quickly area and he subsequently went to his family doctor. He had scans done. In early March, a CT scan was done. This showed bulky necrotic lymphadenopathy in the right sub-pectoral and right axillary region. The dominant lymph node measures 6.4 x 4.2 x 8.6 and meters. There is some other bulky lymph nodes. Outside of that, no other disease was noted. Nonspecific pulmonary nodule 5 mm in the left lower lobe and 4 mm in the right middle lobe.  He did have biopsy done. This was done on 07/29/2015. The pathology report ([id number]) showed melanoma. I will have to see that the tumor necessity for BRAF mutation.  He was seen by radiation oncology. He will be seen by surgery.  He did have a PET scan done. This was done on March 27. This showed marked hypermetabolism in the right axilla. He had no other sites of disease.  He was then referred to the [facility] for an evaluation.  He still looks great. He is quite stout. He's had no weight loss. He's had no change in bowel or bladder habits.  He has had a lot of sunburns.  He's never had any suspicious moles removed. There is no history of melanoma or skin cancer in the family. There is a history of prostate cancer and pancreatic cancer in the family.  He does not smoke. He really has not had any  Alcoholic drink.  He's had no leg swelling. He's had  no hoarseness. He's had no headaches.   He did have MRI of the brain. This was done on March 18. This did not show any metastatic disease.  We are now seeing him to assess for any recommendations for therapy.  Overall, his performance status is ECOG is 0.            No past medical history on file.:  Past Surgical History  Procedure Laterality Date  . Hernia repair      as a baby  . Tonsillectomy      55  . Knee arthroscopy w/ acl reconstruction      rt and lt knee  :   Current outpatient prescriptions:  Marland Kitchen  Multiple Vitamins-Minerals (MULTIVITAMIN GUMMIES ADULT PO), Take by mouth., Disp: , Rfl: :  :  No Known Allergies:  Family History  Problem Relation Age of Onset  . Breast cancer Mother   . Prostate cancer Father   :  Social History   Social History  . Marital Status: Married    Spouse Name: N/A  . Number of Children: 0  . Years of Education: N/A   Occupational History  . Not on file.   Social History Main Topics  . Smoking status: Never Smoker   . Smokeless tobacco: Never Used  . Alcohol Use: 0.0 oz/week  0 Standard drinks or equivalent per week     Comment: mixed drink maybe once a year  . Drug Use: No  . Sexual Activity: Not on file   Other Topics Concern  . Not on file   Social History Narrative  :  Pertinent items are noted in HPI.  Exam: _0 @ Well-developed and well-nourished white male in no obvious distress. Vital signs show temperature of 97.9. Pulse 71. Blood pressure 129/75. Weight is 237 pounds. Head and neck exam shows no ocular or oral lesions. He has no palpable cervical or supraclavicular lymph nodes. Lungs are clear. Cardiac exam regular rate and rhythm with no murmurs, rubs or bruits. Axillary exam shows left axilla with no adenopathy. Right axilla shows a large axillary mass. It is not mobile. It is firm. It probably measures about 8-9 cm in length. It is nontender. Abdomen is soft. Has good bowel sounds.  There is no fluid wave. There is no palpable liver or spleen tip. Back exam shows no tenderness over the spine, ribs or hips. Extremity shows no clubbing, cyanosis or edema. The maybe some slight swelling in the right arm. He has decent range of motion of the right shoulder. Neurological exam shows no focal neurological deficit. Skin exam shows no suspicious height or pigmental lesions.    Recent Labs  08/16/15 0739  WBC 5.7  HGB 14.9  HCT 42.0  PLT 186    Recent Labs  08/16/15 0740  NA 140  K 3.9  CL 103  CO2 27  GLUCOSE 98  BUN 9  CREATININE 0.8  CALCIUM 9.1    Blood smear review:  None  Pathology: See above     Assessment and Plan:  Jonathan Hayes is a 55 year old white male. 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 The primary site is not known. I suspect that we never will find the primary site. It is possible he might be one 5% of melanoma patients in which a primary site cannot be found.  He clearly needs to have surgical debulking in the right axilla. Hopefully, his melanoma can be resected.  He will definitely need adjuvant therapy. I would Definitely recommend adjuvant immunotherapy. I believe Jonathan Hayes would be reasonable. I think this makes a lot of sense. After this, he will need radiation therapy to the right axilla.  I will call pathology to make sure that they run BRAF analysis on the tumor.  I spent about 60 minutes with he and his wife. They're both very nice. It was a lot of fun talking with him.  Hopefully will have surgery soon.  I will probably plan to get him back to see [place] in about 3-4 weeks. By then, he would've had surgery and hopefully has filled up enough.

## 2015-08-17 ENCOUNTER — Encounter (HOSPITAL_COMMUNITY): Payer: Self-pay

## 2015-08-19 ENCOUNTER — Inpatient Hospital Stay (HOSPITAL_COMMUNITY): Admission: RE | Admit: 2015-08-19 | Payer: PRIVATE HEALTH INSURANCE | Source: Ambulatory Visit

## 2015-08-22 ENCOUNTER — Other Ambulatory Visit: Payer: Self-pay | Admitting: Nurse Practitioner

## 2015-08-22 ENCOUNTER — Encounter (HOSPITAL_COMMUNITY): Admission: RE | Payer: Self-pay | Source: Ambulatory Visit

## 2015-08-22 ENCOUNTER — Other Ambulatory Visit: Payer: PRIVATE HEALTH INSURANCE

## 2015-08-22 ENCOUNTER — Ambulatory Visit (HOSPITAL_COMMUNITY): Admission: RE | Admit: 2015-08-22 | Payer: PRIVATE HEALTH INSURANCE | Source: Ambulatory Visit | Admitting: Surgery

## 2015-08-22 SURGERY — LYMPHADENECTOMY, AXILLARY
Anesthesia: General | Laterality: Right

## 2015-08-23 ENCOUNTER — Other Ambulatory Visit: Payer: PRIVATE HEALTH INSURANCE

## 2015-08-23 ENCOUNTER — Ambulatory Visit: Payer: PRIVATE HEALTH INSURANCE

## 2015-08-25 ENCOUNTER — Telehealth: Payer: Self-pay | Admitting: *Deleted

## 2015-08-25 NOTE — Telephone Encounter (Signed)
Patient is scheduled for first Keytruda infusion tomorrow. He wants to know if he can go scuba diving on Monday. Spoke to Dr Marin Olp and he is okay with patient scuba diving on Monday. Patient is aware.

## 2015-08-26 ENCOUNTER — Ambulatory Visit (HOSPITAL_BASED_OUTPATIENT_CLINIC_OR_DEPARTMENT_OTHER): Payer: PRIVATE HEALTH INSURANCE

## 2015-08-26 ENCOUNTER — Other Ambulatory Visit: Payer: Self-pay | Admitting: *Deleted

## 2015-08-26 ENCOUNTER — Other Ambulatory Visit (HOSPITAL_BASED_OUTPATIENT_CLINIC_OR_DEPARTMENT_OTHER): Payer: PRIVATE HEALTH INSURANCE

## 2015-08-26 VITALS — BP 134/79 | HR 70 | Temp 97.7°F | Wt 236.0 lb

## 2015-08-26 DIAGNOSIS — Z5111 Encounter for antineoplastic chemotherapy: Secondary | ICD-10-CM

## 2015-08-26 DIAGNOSIS — R222 Localized swelling, mass and lump, trunk: Secondary | ICD-10-CM | POA: Diagnosis not present

## 2015-08-26 DIAGNOSIS — C792 Secondary malignant neoplasm of skin: Secondary | ICD-10-CM

## 2015-08-26 DIAGNOSIS — C439 Malignant melanoma of skin, unspecified: Secondary | ICD-10-CM

## 2015-08-26 DIAGNOSIS — C4359 Malignant melanoma of other part of trunk: Secondary | ICD-10-CM

## 2015-08-26 LAB — CBC WITH DIFFERENTIAL (CANCER CENTER ONLY)
BASO#: 0 10*3/uL (ref 0.0–0.2)
BASO%: 0 % (ref 0.0–2.0)
EOS%: 1.6 % (ref 0.0–7.0)
Eosinophils Absolute: 0.1 10*3/uL (ref 0.0–0.5)
HEMATOCRIT: 40.3 % (ref 38.7–49.9)
HEMOGLOBIN: 14.4 g/dL (ref 13.0–17.1)
LYMPH#: 0.9 10*3/uL (ref 0.9–3.3)
LYMPH%: 16.7 % (ref 14.0–48.0)
MCH: 29.2 pg (ref 28.0–33.4)
MCHC: 35.7 g/dL (ref 32.0–35.9)
MCV: 82 fL (ref 82–98)
MONO#: 0.4 10*3/uL (ref 0.1–0.9)
MONO%: 7.6 % (ref 0.0–13.0)
NEUT%: 74.1 % (ref 40.0–80.0)
NEUTROS ABS: 4.1 10*3/uL (ref 1.5–6.5)
Platelets: 195 10*3/uL (ref 145–400)
RBC: 4.93 10*6/uL (ref 4.20–5.70)
RDW: 12.8 % (ref 11.1–15.7)
WBC: 5.5 10*3/uL (ref 4.0–10.0)

## 2015-08-26 LAB — CMP (CANCER CENTER ONLY)
ALT(SGPT): 38 U/L (ref 10–47)
AST: 35 U/L (ref 11–38)
Albumin: 3.6 g/dL (ref 3.3–5.5)
Alkaline Phosphatase: 85 U/L — ABNORMAL HIGH (ref 26–84)
BUN, Bld: 9 mg/dL (ref 7–22)
CO2: 28 meq/L (ref 18–33)
Calcium: 9.2 mg/dL (ref 8.0–10.3)
Chloride: 103 meq/L (ref 98–108)
Creat: 1 mg/dL (ref 0.6–1.2)
Glucose, Bld: 120 mg/dL — ABNORMAL HIGH (ref 73–118)
Potassium: 4 meq/L (ref 3.3–4.7)
Sodium: 135 meq/L (ref 128–145)
Total Bilirubin: 0.9 mg/dL (ref 0.20–1.60)
Total Protein: 7 g/dL (ref 6.4–8.1)

## 2015-08-26 LAB — LACTATE DEHYDROGENASE: LDH: 668 U/L — AB (ref 125–245)

## 2015-08-26 MED ORDER — PROCHLORPERAZINE MALEATE 10 MG PO TABS
10.0000 mg | ORAL_TABLET | Freq: Four times a day (QID) | ORAL | Status: DC | PRN
Start: 2015-08-26 — End: 2016-02-22

## 2015-08-26 MED ORDER — SODIUM CHLORIDE 0.9 % IV SOLN
Freq: Once | INTRAVENOUS | Status: AC
  Administered 2015-08-26: 13:00:00 via INTRAVENOUS

## 2015-08-26 MED ORDER — PEMBROLIZUMAB CHEMO INJECTION 100 MG/4ML
200.0000 mg | Freq: Once | INTRAVENOUS | Status: AC
  Administered 2015-08-26: 200 mg via INTRAVENOUS
  Filled 2015-08-26: qty 8

## 2015-08-26 MED FILL — PROCHLORPERAZINE 10 MG TAB: 10 | 7 days supply | Qty: 30 | Fill #0

## 2015-08-26 NOTE — Patient Instructions (Addendum)
[facility] Discharge Instructions for Patients Receiving Chemotherapy  Today you received the following chemotherapy agents:  Keytruda  To help prevent nausea and vomiting after your treatment, we encourage you to take your nausea medications as directed on the bottle, we have sent a Rx to the [facility] for Compazine.   If you develop nausea and vomiting that is not controlled by your nausea medication, call the clinic.   BELOW ARE SYMPTOMS THAT SHOULD BE REPORTED IMMEDIATELY:  *FEVER GREATER THAN 100.5 F  *CHILLS WITH OR WITHOUT FEVER  NAUSEA AND VOMITING THAT IS NOT CONTROLLED WITH YOUR NAUSEA MEDICATION  *UNUSUAL SHORTNESS OF BREATH  *UNUSUAL BRUISING OR BLEEDING  TENDERNESS IN MOUTH AND THROAT WITH OR WITHOUT PRESENCE OF ULCERS  *URINARY PROBLEMS  *BOWEL PROBLEMS  UNUSUAL RASH Items with * indicate a potential emergency and should be followed up as soon as possible.  Feel free to call the clinic you have any questions or concerns. The clinic phone number is [phone].  Please show the [facility] at check-in to the Emergency Department and triage nurse.  Pembrolizumab injection What is this medicine? PEMBROLIZUMAB (pem broe liz ue mab) is a monoclonal antibody. It is used to treat melanoma and non-small cell lung cancer. This medicine may be used for other purposes; ask your health care provider or pharmacist if you have questions. What should I tell my health care provider before I take this medicine? They need to know if you have any of these conditions: -diabetes -immune system problems -inflammatory bowel disease -liver disease -lung or breathing disease -lupus -an unusual or allergic reaction to pembrolizumab, other medicines, foods, dyes, or preservatives -pregnant or trying to get pregnant -breast-feeding How should I use this medicine? This medicine is for infusion into a vein. It is given by a health care professional  in a hospital or clinic setting. A special MedGuide will be given to you before each treatment. Be sure to read this information carefully each time. Talk to your pediatrician regarding the use of this medicine in children. Special care may be needed. Overdosage: If you think you have taken too much of this medicine contact a poison control center or emergency room at once. NOTE: This medicine is only for you. Do not share this medicine with others. What if I miss a dose? It is important not to miss your dose. Call your doctor or health care professional if you are unable to keep an appointment. What may interact with this medicine? Interactions have not been studied. Give your health care provider a list of all the medicines, herbs, non-prescription drugs, or dietary supplements you use. Also tell them if you smoke, drink alcohol, or use illegal drugs. Some items may interact with your medicine. This list may not describe all possible interactions. Give your health care provider a list of all the medicines, herbs, non-prescription drugs, or dietary supplements you use. Also tell them if you smoke, drink alcohol, or use illegal drugs. Some items may interact with your medicine. What should I watch for while using this medicine? Your condition will be monitored carefully while you are receiving this medicine. You may need blood work done while you are taking this medicine. Do not become pregnant while taking this medicine or for 4 months after stopping it. Women should inform their doctor if they wish to become pregnant or think they might be pregnant. There is a potential for serious side effects to an unborn child. Talk  to your health care professional or pharmacist for more information. Do not breast-feed an infant while taking this medicine or for 4 months after the last dose. What side effects may I notice from receiving this medicine? Side effects that you should report to your doctor or health  care professional as soon as possible: -allergic reactions like skin rash, itching or hives, swelling of the face, lips, or tongue -bloody or black, tarry stools -breathing problems -change in the amount of urine -changes in vision -chest pain -chills -dark urine -dizziness or feeling faint or lightheaded -fast or irregular heartbeat -fever -flushing -hair loss -muscle pain -muscle weakness -persistent headache -signs and symptoms of high blood sugar such as dizziness; dry mouth; dry skin; fruity breath; nausea; stomach pain; increased hunger or thirst; increased urination -signs and symptoms of liver injury like dark urine, light-colored stools, loss of appetite, nausea, right upper belly pain, yellowing of the eyes or skin -stomach pain -weight loss Side effects that usually do not require medical attention (Report these to your doctor or health care professional if they continue or are bothersome.):constipation -cough -diarrhea -joint pain -tiredness This list may not describe all possible side effects. Call your doctor for medical advice about side effects. You may report side effects to FDA at 1-800-FDA-1088. Where should I keep my medicine? This drug is given in a hospital or clinic and will not be stored at home. NOTE: This sheet is a summary. It may not cover all possible information. If you have questions about this medicine, talk to your doctor, pharmacist, or health care provider.    2016, Elsevier/Gold Standard. (2014-07-06 17:24:19)

## 2015-08-29 ENCOUNTER — Telehealth: Payer: Self-pay | Admitting: Oncology

## 2015-08-29 NOTE — Telephone Encounter (Signed)
Left message on home number, no voicemail on wife's phone.

## 2015-08-30 ENCOUNTER — Encounter: Payer: Self-pay | Admitting: Hematology & Oncology

## 2015-08-31 ENCOUNTER — Other Ambulatory Visit: Payer: Self-pay | Admitting: *Deleted

## 2015-08-31 ENCOUNTER — Telehealth: Payer: Self-pay | Admitting: *Deleted

## 2015-08-31 NOTE — Telephone Encounter (Signed)
Patient c/o increased swelling to R arm. The arm has been swelling since his biopsy, but in the last few days it's gotten worse. There is no redness, no pain, no warmth. He is keeping it elevated. It's swollen to the point that ROM is now affected in the hand, wrist and elbow joint.  Spoke with Dr Marin Olp who wants patient to continuing elevation. He also suggests the use of a compression sleeve. He does not believe heat and/or ice would be effective.   Instructed patient on continued elevation and offered prescription for compression sleeve. He states he already has a sleeve so he doesn't need script. Instructed him to call the office if that changes.

## 2015-09-12 ENCOUNTER — Other Ambulatory Visit: Payer: Self-pay | Admitting: *Deleted

## 2015-09-12 DIAGNOSIS — C4359 Malignant melanoma of other part of trunk: Secondary | ICD-10-CM

## 2015-09-13 ENCOUNTER — Other Ambulatory Visit (HOSPITAL_BASED_OUTPATIENT_CLINIC_OR_DEPARTMENT_OTHER): Payer: PRIVATE HEALTH INSURANCE

## 2015-09-13 ENCOUNTER — Ambulatory Visit (HOSPITAL_BASED_OUTPATIENT_CLINIC_OR_DEPARTMENT_OTHER): Payer: PRIVATE HEALTH INSURANCE

## 2015-09-13 VITALS — BP 120/80 | HR 71 | Temp 98.0°F | Resp 20

## 2015-09-13 DIAGNOSIS — C439 Malignant melanoma of skin, unspecified: Secondary | ICD-10-CM

## 2015-09-13 DIAGNOSIS — C792 Secondary malignant neoplasm of skin: Secondary | ICD-10-CM

## 2015-09-13 DIAGNOSIS — Z79899 Other long term (current) drug therapy: Secondary | ICD-10-CM

## 2015-09-13 DIAGNOSIS — C4359 Malignant melanoma of other part of trunk: Secondary | ICD-10-CM

## 2015-09-13 DIAGNOSIS — R222 Localized swelling, mass and lump, trunk: Secondary | ICD-10-CM

## 2015-09-13 DIAGNOSIS — Z5112 Encounter for antineoplastic immunotherapy: Secondary | ICD-10-CM

## 2015-09-13 LAB — CBC WITH DIFFERENTIAL (CANCER CENTER ONLY)
BASO#: 0 10*3/uL (ref 0.0–0.2)
BASO%: 0.2 % (ref 0.0–2.0)
EOS%: 1.3 % (ref 0.0–7.0)
Eosinophils Absolute: 0.1 10*3/uL (ref 0.0–0.5)
HEMATOCRIT: 39.8 % (ref 38.7–49.9)
HEMOGLOBIN: 14.1 g/dL (ref 13.0–17.1)
LYMPH#: 1.4 10*3/uL (ref 0.9–3.3)
LYMPH%: 26.2 % (ref 14.0–48.0)
MCH: 28.7 pg (ref 28.0–33.4)
MCHC: 35.4 g/dL (ref 32.0–35.9)
MCV: 81 fL — AB (ref 82–98)
MONO#: 0.3 10*3/uL (ref 0.1–0.9)
MONO%: 5.8 % (ref 0.0–13.0)
NEUT#: 3.7 10*3/uL (ref 1.5–6.5)
NEUT%: 66.5 % (ref 40.0–80.0)
PLATELETS: 208 10*3/uL (ref 145–400)
RBC: 4.91 10*6/uL (ref 4.20–5.70)
RDW: 13.1 % (ref 11.1–15.7)
WBC: 5.5 10*3/uL (ref 4.0–10.0)

## 2015-09-13 LAB — CMP (CANCER CENTER ONLY)
ALT(SGPT): 41 U/L (ref 10–47)
AST: 32 U/L (ref 11–38)
Albumin: 3.6 g/dL (ref 3.3–5.5)
Alkaline Phosphatase: 80 U/L (ref 26–84)
BUN: 12 mg/dL (ref 7–22)
CHLORIDE: 102 meq/L (ref 98–108)
CO2: 25 meq/L (ref 18–33)
CREATININE: 1 mg/dL (ref 0.6–1.2)
Calcium: 9.3 mg/dL (ref 8.0–10.3)
GLUCOSE: 138 mg/dL — AB (ref 73–118)
Potassium: 3.6 mEq/L (ref 3.3–4.7)
SODIUM: 138 meq/L (ref 128–145)
TOTAL PROTEIN: 7.3 g/dL (ref 6.4–8.1)
Total Bilirubin: 1 mg/dl (ref 0.20–1.60)

## 2015-09-13 LAB — TSH: TSH: 1.635 m[IU]/L (ref 0.320–4.118)

## 2015-09-13 MED ORDER — SODIUM CHLORIDE 0.9 % IV SOLN
200.0000 mg | Freq: Once | INTRAVENOUS | Status: AC
  Administered 2015-09-13: 200 mg via INTRAVENOUS
  Filled 2015-09-13: qty 8

## 2015-09-13 MED ORDER — SODIUM CHLORIDE 0.9 % IV SOLN
Freq: Once | INTRAVENOUS | Status: AC
  Administered 2015-09-13: 10:00:00 via INTRAVENOUS

## 2015-09-13 NOTE — Patient Instructions (Signed)
[facility] Discharge Instructions for Patients Receiving Chemotherapy  Today you received the following chemotherapy agents:  Keytruda  To help prevent nausea and vomiting after your treatment, we encourage you to take your nausea medications as directed on the bottle, we have sent a Rx to the [facility] for Compazine.   If you develop nausea and vomiting that is not controlled by your nausea medication, call the clinic.   BELOW ARE SYMPTOMS THAT SHOULD BE REPORTED IMMEDIATELY:  *FEVER GREATER THAN 100.5 F  *CHILLS WITH OR WITHOUT FEVER  NAUSEA AND VOMITING THAT IS NOT CONTROLLED WITH YOUR NAUSEA MEDICATION  *UNUSUAL SHORTNESS OF BREATH  *UNUSUAL BRUISING OR BLEEDING  TENDERNESS IN MOUTH AND THROAT WITH OR WITHOUT PRESENCE OF ULCERS  *URINARY PROBLEMS  *BOWEL PROBLEMS  UNUSUAL RASH Items with * indicate a potential emergency and should be followed up as soon as possible.  Feel free to call the clinic you have any questions or concerns. The clinic phone number is [phone].  Please show the [facility] at check-in to the Emergency Department and triage nurse.  Pembrolizumab injection What is this medicine? PEMBROLIZUMAB (pem broe liz ue mab) is a monoclonal antibody. It is used to treat melanoma and non-small cell lung cancer. This medicine may be used for other purposes; ask your health care provider or pharmacist if you have questions. What should I tell my health care provider before I take this medicine? They need to know if you have any of these conditions: -diabetes -immune system problems -inflammatory bowel disease -liver disease -lung or breathing disease -lupus -an unusual or allergic reaction to pembrolizumab, other medicines, foods, dyes, or preservatives -pregnant or trying to get pregnant -breast-feeding How should I use this medicine? This medicine is for infusion into a vein. It is given by a health care professional  in a hospital or clinic setting. A special MedGuide will be given to you before each treatment. Be sure to read this information carefully each time. Talk to your pediatrician regarding the use of this medicine in children. Special care may be needed. Overdosage: If you think you have taken too much of this medicine contact a poison control center or emergency room at once. NOTE: This medicine is only for you. Do not share this medicine with others. What if I miss a dose? It is important not to miss your dose. Call your doctor or health care professional if you are unable to keep an appointment. What may interact with this medicine? Interactions have not been studied. Give your health care provider a list of all the medicines, herbs, non-prescription drugs, or dietary supplements you use. Also tell them if you smoke, drink alcohol, or use illegal drugs. Some items may interact with your medicine. This list may not describe all possible interactions. Give your health care provider a list of all the medicines, herbs, non-prescription drugs, or dietary supplements you use. Also tell them if you smoke, drink alcohol, or use illegal drugs. Some items may interact with your medicine. What should I watch for while using this medicine? Your condition will be monitored carefully while you are receiving this medicine. You may need blood work done while you are taking this medicine. Do not become pregnant while taking this medicine or for 4 months after stopping it. Women should inform their doctor if they wish to become pregnant or think they might be pregnant. There is a potential for serious side effects to an unborn child. Talk  to your health care professional or pharmacist for more information. Do not breast-feed an infant while taking this medicine or for 4 months after the last dose. What side effects may I notice from receiving this medicine? Side effects that you should report to your doctor or health  care professional as soon as possible: -allergic reactions like skin rash, itching or hives, swelling of the face, lips, or tongue -bloody or black, tarry stools -breathing problems -change in the amount of urine -changes in vision -chest pain -chills -dark urine -dizziness or feeling faint or lightheaded -fast or irregular heartbeat -fever -flushing -hair loss -muscle pain -muscle weakness -persistent headache -signs and symptoms of high blood sugar such as dizziness; dry mouth; dry skin; fruity breath; nausea; stomach pain; increased hunger or thirst; increased urination -signs and symptoms of liver injury like dark urine, light-colored stools, loss of appetite, nausea, right upper belly pain, yellowing of the eyes or skin -stomach pain -weight loss Side effects that usually do not require medical attention (Report these to your doctor or health care professional if they continue or are bothersome.):constipation -cough -diarrhea -joint pain -tiredness This list may not describe all possible side effects. Call your doctor for medical advice about side effects. You may report side effects to FDA at 1-800-FDA-1088. Where should I keep my medicine? This drug is given in a hospital or clinic and will not be stored at home. NOTE: This sheet is a summary. It may not cover all possible information. If you have questions about this medicine, talk to your doctor, pharmacist, or health care provider.    2016, Elsevier/Gold Standard. (2014-07-06 17:24:19)

## 2015-09-13 NOTE — Progress Notes (Signed)
Drug replacement for Keytruda sent to [facility] as requested. Case: [id number] Fax: [phone]. Confirmation received.

## 2015-10-04 ENCOUNTER — Other Ambulatory Visit: Payer: PRIVATE HEALTH INSURANCE

## 2015-10-04 ENCOUNTER — Ambulatory Visit: Payer: PRIVATE HEALTH INSURANCE

## 2015-10-06 ENCOUNTER — Other Ambulatory Visit: Payer: Self-pay | Admitting: Hematology & Oncology

## 2015-10-06 DIAGNOSIS — C4359 Malignant melanoma of other part of trunk: Secondary | ICD-10-CM

## 2015-10-07 ENCOUNTER — Other Ambulatory Visit (HOSPITAL_BASED_OUTPATIENT_CLINIC_OR_DEPARTMENT_OTHER): Payer: PRIVATE HEALTH INSURANCE

## 2015-10-07 ENCOUNTER — Other Ambulatory Visit: Payer: Self-pay | Admitting: *Deleted

## 2015-10-07 ENCOUNTER — Ambulatory Visit (HOSPITAL_BASED_OUTPATIENT_CLINIC_OR_DEPARTMENT_OTHER): Payer: PRIVATE HEALTH INSURANCE

## 2015-10-07 VITALS — BP 125/64 | HR 78 | Temp 97.6°F | Resp 18

## 2015-10-07 DIAGNOSIS — Z5112 Encounter for antineoplastic immunotherapy: Secondary | ICD-10-CM

## 2015-10-07 DIAGNOSIS — C439 Malignant melanoma of skin, unspecified: Secondary | ICD-10-CM

## 2015-10-07 DIAGNOSIS — R222 Localized swelling, mass and lump, trunk: Secondary | ICD-10-CM

## 2015-10-07 DIAGNOSIS — C4359 Malignant melanoma of other part of trunk: Secondary | ICD-10-CM

## 2015-10-07 DIAGNOSIS — C792 Secondary malignant neoplasm of skin: Secondary | ICD-10-CM

## 2015-10-07 LAB — CMP (CANCER CENTER ONLY)
ALBUMIN: 3.9 g/dL (ref 3.3–5.5)
ALK PHOS: 63 U/L (ref 26–84)
ALT: 33 U/L (ref 10–47)
AST: 32 U/L (ref 11–38)
BUN: 11 mg/dL (ref 7–22)
CALCIUM: 8.5 mg/dL (ref 8.0–10.3)
CO2: 24 mEq/L (ref 18–33)
Chloride: 103 mEq/L (ref 98–108)
Creat: 1 mg/dl (ref 0.6–1.2)
Glucose, Bld: 90 mg/dL (ref 73–118)
POTASSIUM: 3.9 meq/L (ref 3.3–4.7)
Sodium: 135 mEq/L (ref 128–145)
TOTAL PROTEIN: 6.7 g/dL (ref 6.4–8.1)
Total Bilirubin: 0.9 mg/dl (ref 0.20–1.60)

## 2015-10-07 LAB — CBC WITH DIFFERENTIAL (CANCER CENTER ONLY)
BASO#: 0 10*3/uL (ref 0.0–0.2)
BASO%: 0.2 % (ref 0.0–2.0)
EOS ABS: 0.1 10*3/uL (ref 0.0–0.5)
EOS%: 1 % (ref 0.0–7.0)
HEMATOCRIT: 38 % — AB (ref 38.7–49.9)
HEMOGLOBIN: 13.5 g/dL (ref 13.0–17.1)
LYMPH#: 1.7 10*3/uL (ref 0.9–3.3)
LYMPH%: 28.7 % (ref 14.0–48.0)
MCH: 29.3 pg (ref 28.0–33.4)
MCHC: 35.5 g/dL (ref 32.0–35.9)
MCV: 82 fL (ref 82–98)
MONO#: 0.5 10*3/uL (ref 0.1–0.9)
MONO%: 8 % (ref 0.0–13.0)
NEUT#: 3.7 10*3/uL (ref 1.5–6.5)
NEUT%: 62.1 % (ref 40.0–80.0)
Platelets: 192 10*3/uL (ref 145–400)
RBC: 4.61 10*6/uL (ref 4.20–5.70)
RDW: 13.5 % (ref 11.1–15.7)
WBC: 6 10*3/uL (ref 4.0–10.0)

## 2015-10-07 MED ORDER — SODIUM CHLORIDE 0.9 % IV SOLN
Freq: Once | INTRAVENOUS | Status: AC
  Administered 2015-10-07: 14:00:00 via INTRAVENOUS

## 2015-10-07 MED ORDER — SODIUM CHLORIDE 0.9 % IV SOLN
200.0000 mg | Freq: Once | INTRAVENOUS | Status: AC
  Administered 2015-10-07: 200 mg via INTRAVENOUS
  Filled 2015-10-07: qty 8

## 2015-10-07 NOTE — Patient Instructions (Signed)
Pembrolizumab injection What is this medicine? PEMBROLIZUMAB (pem broe liz ue mab) is a monoclonal antibody. It is used to treat melanoma and non-small cell lung cancer. This medicine may be used for other purposes; ask your health care provider or pharmacist if you have questions. What should I tell my health care provider before I take this medicine? They need to know if you have any of these conditions: -diabetes -immune system problems -inflammatory bowel disease -liver disease -lung or breathing disease -lupus -an unusual or allergic reaction to pembrolizumab, other medicines, foods, dyes, or preservatives -pregnant or trying to get pregnant -breast-feeding How should I use this medicine? This medicine is for infusion into a vein. It is given by a health care professional in a hospital or clinic setting. A special MedGuide will be given to you before each treatment. Be sure to read this information carefully each time. Talk to your pediatrician regarding the use of this medicine in children. Special care may be needed. Overdosage: If you think you have taken too much of this medicine contact a poison control center or emergency room at once. NOTE: This medicine is only for you. Do not share this medicine with others. What if I miss a dose? It is important not to miss your dose. Call your doctor or health care professional if you are unable to keep an appointment. What may interact with this medicine? Interactions have not been studied. Give your health care provider a list of all the medicines, herbs, non-prescription drugs, or dietary supplements you use. Also tell them if you smoke, drink alcohol, or use illegal drugs. Some items may interact with your medicine. This list may not describe all possible interactions. Give your health care provider a list of all the medicines, herbs, non-prescription drugs, or dietary supplements you use. Also tell them if you smoke, drink alcohol, or  use illegal drugs. Some items may interact with your medicine. What should I watch for while using this medicine? Your condition will be monitored carefully while you are receiving this medicine. You may need blood work done while you are taking this medicine. Do not become pregnant while taking this medicine or for 4 months after stopping it. Women should inform their doctor if they wish to become pregnant or think they might be pregnant. There is a potential for serious side effects to an unborn child. Talk to your health care professional or pharmacist for more information. Do not breast-feed an infant while taking this medicine or for 4 months after the last dose. What side effects may I notice from receiving this medicine? Side effects that you should report to your doctor or health care professional as soon as possible: -allergic reactions like skin rash, itching or hives, swelling of the face, lips, or tongue -bloody or black, tarry stools -breathing problems -change in the amount of urine -changes in vision -chest pain -chills -dark urine -dizziness or feeling faint or lightheaded -fast or irregular heartbeat -fever -flushing -hair loss -muscle pain -muscle weakness -persistent headache -signs and symptoms of high blood sugar such as dizziness; dry mouth; dry skin; fruity breath; nausea; stomach pain; increased hunger or thirst; increased urination -signs and symptoms of liver injury like dark urine, light-colored stools, loss of appetite, nausea, right upper belly pain, yellowing of the eyes or skin -stomach pain -weight loss Side effects that usually do not require medical attention (Report these to your doctor or health care professional if they continue or are bothersome.):constipation -cough -diarrhea -joint pain -  tiredness This list may not describe all possible side effects. Call your doctor for medical advice about side effects. You may report side effects to FDA at  1-800-FDA-1088. Where should I keep my medicine? This drug is given in a hospital or clinic and will not be stored at home. NOTE: This sheet is a summary. It may not cover all possible information. If you have questions about this medicine, talk to your doctor, pharmacist, or health care provider.    2016, Elsevier/Gold Standard. (2014-07-06 17:24:19)  

## 2015-10-25 ENCOUNTER — Ambulatory Visit (HOSPITAL_BASED_OUTPATIENT_CLINIC_OR_DEPARTMENT_OTHER): Payer: PRIVATE HEALTH INSURANCE

## 2015-10-25 ENCOUNTER — Other Ambulatory Visit (HOSPITAL_BASED_OUTPATIENT_CLINIC_OR_DEPARTMENT_OTHER): Payer: PRIVATE HEALTH INSURANCE

## 2015-10-25 VITALS — BP 118/78 | HR 59 | Temp 98.1°F | Resp 18

## 2015-10-25 DIAGNOSIS — C439 Malignant melanoma of skin, unspecified: Secondary | ICD-10-CM

## 2015-10-25 DIAGNOSIS — R222 Localized swelling, mass and lump, trunk: Secondary | ICD-10-CM

## 2015-10-25 DIAGNOSIS — C4359 Malignant melanoma of other part of trunk: Secondary | ICD-10-CM

## 2015-10-25 DIAGNOSIS — C792 Secondary malignant neoplasm of skin: Secondary | ICD-10-CM | POA: Diagnosis not present

## 2015-10-25 DIAGNOSIS — Z5112 Encounter for antineoplastic immunotherapy: Secondary | ICD-10-CM

## 2015-10-25 LAB — CMP (CANCER CENTER ONLY)
ALT: 35 U/L (ref 10–47)
AST: 29 U/L (ref 11–38)
Albumin: 3.7 g/dL (ref 3.3–5.5)
Alkaline Phosphatase: 75 U/L (ref 26–84)
BILIRUBIN TOTAL: 0.9 mg/dL (ref 0.20–1.60)
BUN: 11 mg/dL (ref 7–22)
CO2: 27 meq/L (ref 18–33)
CREATININE: 0.9 mg/dL (ref 0.6–1.2)
Calcium: 9.6 mg/dL (ref 8.0–10.3)
Chloride: 105 mEq/L (ref 98–108)
GLUCOSE: 102 mg/dL (ref 73–118)
Potassium: 3.6 mEq/L (ref 3.3–4.7)
SODIUM: 138 meq/L (ref 128–145)
Total Protein: 7.1 g/dL (ref 6.4–8.1)

## 2015-10-25 LAB — CBC WITH DIFFERENTIAL (CANCER CENTER ONLY)
BASO#: 0 10*3/uL (ref 0.0–0.2)
BASO%: 0.2 % (ref 0.0–2.0)
EOS%: 2.3 % (ref 0.0–7.0)
Eosinophils Absolute: 0.1 10*3/uL (ref 0.0–0.5)
HCT: 42.8 % (ref 38.7–49.9)
HGB: 14.9 g/dL (ref 13.0–17.1)
LYMPH#: 1.3 10*3/uL (ref 0.9–3.3)
LYMPH%: 25.6 % (ref 14.0–48.0)
MCH: 29.1 pg (ref 28.0–33.4)
MCHC: 34.8 g/dL (ref 32.0–35.9)
MCV: 84 fL (ref 82–98)
MONO#: 0.4 10*3/uL (ref 0.1–0.9)
MONO%: 7.9 % (ref 0.0–13.0)
NEUT#: 3.3 10*3/uL (ref 1.5–6.5)
NEUT%: 64 % (ref 40.0–80.0)
PLATELETS: 168 10*3/uL (ref 145–400)
RBC: 5.12 10*6/uL (ref 4.20–5.70)
RDW: 13.8 % (ref 11.1–15.7)
WBC: 5.2 10*3/uL (ref 4.0–10.0)

## 2015-10-25 LAB — LACTATE DEHYDROGENASE: LDH: 197 U/L (ref 125–245)

## 2015-10-25 MED ORDER — SODIUM CHLORIDE 0.9 % IV SOLN
Freq: Once | INTRAVENOUS | Status: AC
  Administered 2015-10-25: 10:00:00 via INTRAVENOUS

## 2015-10-25 MED ORDER — SODIUM CHLORIDE 0.9 % IV SOLN
200.0000 mg | Freq: Once | INTRAVENOUS | Status: AC
  Administered 2015-10-25: 200 mg via INTRAVENOUS
  Filled 2015-10-25: qty 8

## 2015-10-25 NOTE — Patient Instructions (Signed)
[facility] Discharge Instructions for Patients Receiving Chemotherapy  Today you received the following chemotherapy agents:  Keytruda  To help prevent nausea and vomiting after your treatment, we encourage you to take your nausea medications as directed on the bottle, we have sent a Rx to the [facility] for Compazine.   If you develop nausea and vomiting that is not controlled by your nausea medication, call the clinic.   BELOW ARE SYMPTOMS THAT SHOULD BE REPORTED IMMEDIATELY:  *FEVER GREATER THAN 100.5 F  *CHILLS WITH OR WITHOUT FEVER  NAUSEA AND VOMITING THAT IS NOT CONTROLLED WITH YOUR NAUSEA MEDICATION  *UNUSUAL SHORTNESS OF BREATH  *UNUSUAL BRUISING OR BLEEDING  TENDERNESS IN MOUTH AND THROAT WITH OR WITHOUT PRESENCE OF ULCERS  *URINARY PROBLEMS  *BOWEL PROBLEMS  UNUSUAL RASH Items with * indicate a potential emergency and should be followed up as soon as possible.  Feel free to call the clinic you have any questions or concerns. The clinic phone number is [phone].  Please show the [facility] at check-in to the Emergency Department and triage nurse.  Pembrolizumab injection What is this medicine? PEMBROLIZUMAB (pem broe liz ue mab) is a monoclonal antibody. It is used to treat melanoma and non-small cell lung cancer. This medicine may be used for other purposes; ask your health care provider or pharmacist if you have questions. What should I tell my health care provider before I take this medicine? They need to know if you have any of these conditions: -diabetes -immune system problems -inflammatory bowel disease -liver disease -lung or breathing disease -lupus -an unusual or allergic reaction to pembrolizumab, other medicines, foods, dyes, or preservatives -pregnant or trying to get pregnant -breast-feeding How should I use this medicine? This medicine is for infusion into a vein. It is given by a health care professional  in a hospital or clinic setting. A special MedGuide will be given to you before each treatment. Be sure to read this information carefully each time. Talk to your pediatrician regarding the use of this medicine in children. Special care may be needed. Overdosage: If you think you have taken too much of this medicine contact a poison control center or emergency room at once. NOTE: This medicine is only for you. Do not share this medicine with others. What if I miss a dose? It is important not to miss your dose. Call your doctor or health care professional if you are unable to keep an appointment. What may interact with this medicine? Interactions have not been studied. Give your health care provider a list of all the medicines, herbs, non-prescription drugs, or dietary supplements you use. Also tell them if you smoke, drink alcohol, or use illegal drugs. Some items may interact with your medicine. This list may not describe all possible interactions. Give your health care provider a list of all the medicines, herbs, non-prescription drugs, or dietary supplements you use. Also tell them if you smoke, drink alcohol, or use illegal drugs. Some items may interact with your medicine. What should I watch for while using this medicine? Your condition will be monitored carefully while you are receiving this medicine. You may need blood work done while you are taking this medicine. Do not become pregnant while taking this medicine or for 4 months after stopping it. Women should inform their doctor if they wish to become pregnant or think they might be pregnant. There is a potential for serious side effects to an unborn child. Talk  to your health care professional or pharmacist for more information. Do not breast-feed an infant while taking this medicine or for 4 months after the last dose. What side effects may I notice from receiving this medicine? Side effects that you should report to your doctor or health  care professional as soon as possible: -allergic reactions like skin rash, itching or hives, swelling of the face, lips, or tongue -bloody or black, tarry stools -breathing problems -change in the amount of urine -changes in vision -chest pain -chills -dark urine -dizziness or feeling faint or lightheaded -fast or irregular heartbeat -fever -flushing -hair loss -muscle pain -muscle weakness -persistent headache -signs and symptoms of high blood sugar such as dizziness; dry mouth; dry skin; fruity breath; nausea; stomach pain; increased hunger or thirst; increased urination -signs and symptoms of liver injury like dark urine, light-colored stools, loss of appetite, nausea, right upper belly pain, yellowing of the eyes or skin -stomach pain -weight loss Side effects that usually do not require medical attention (Report these to your doctor or health care professional if they continue or are bothersome.):constipation -cough -diarrhea -joint pain -tiredness This list may not describe all possible side effects. Call your doctor for medical advice about side effects. You may report side effects to FDA at 1-800-FDA-1088. Where should I keep my medicine? This drug is given in a hospital or clinic and will not be stored at home. NOTE: This sheet is a summary. It may not cover all possible information. If you have questions about this medicine, talk to your doctor, pharmacist, or health care provider.    2016, Elsevier/Gold Standard. (2014-07-06 17:24:19)

## 2015-11-14 ENCOUNTER — Other Ambulatory Visit: Payer: Self-pay

## 2015-11-14 DIAGNOSIS — C4359 Malignant melanoma of other part of trunk: Secondary | ICD-10-CM

## 2015-11-15 ENCOUNTER — Ambulatory Visit (HOSPITAL_BASED_OUTPATIENT_CLINIC_OR_DEPARTMENT_OTHER): Payer: PRIVATE HEALTH INSURANCE

## 2015-11-15 ENCOUNTER — Ambulatory Visit (HOSPITAL_BASED_OUTPATIENT_CLINIC_OR_DEPARTMENT_OTHER): Payer: PRIVATE HEALTH INSURANCE | Admitting: Hematology & Oncology

## 2015-11-15 ENCOUNTER — Other Ambulatory Visit (HOSPITAL_BASED_OUTPATIENT_CLINIC_OR_DEPARTMENT_OTHER): Payer: PRIVATE HEALTH INSURANCE

## 2015-11-15 ENCOUNTER — Encounter: Payer: Self-pay | Admitting: Hematology & Oncology

## 2015-11-15 VITALS — BP 123/76 | HR 51 | Temp 97.9°F | Resp 18 | Ht 73.0 in | Wt 228.1 lb

## 2015-11-15 DIAGNOSIS — C439 Malignant melanoma of skin, unspecified: Secondary | ICD-10-CM | POA: Diagnosis not present

## 2015-11-15 DIAGNOSIS — C4359 Malignant melanoma of other part of trunk: Secondary | ICD-10-CM

## 2015-11-15 DIAGNOSIS — Z5112 Encounter for antineoplastic immunotherapy: Secondary | ICD-10-CM

## 2015-11-15 DIAGNOSIS — C779 Secondary and unspecified malignant neoplasm of lymph node, unspecified: Secondary | ICD-10-CM

## 2015-11-15 DIAGNOSIS — R222 Localized swelling, mass and lump, trunk: Secondary | ICD-10-CM

## 2015-11-15 LAB — CMP (CANCER CENTER ONLY)
ALBUMIN: 4 g/dL (ref 3.3–5.5)
ALT(SGPT): 31 U/L (ref 10–47)
AST: 27 U/L (ref 11–38)
Alkaline Phosphatase: 70 U/L (ref 26–84)
BUN, Bld: 16 mg/dL (ref 7–22)
CALCIUM: 9.6 mg/dL (ref 8.0–10.3)
CO2: 29 meq/L (ref 18–33)
Chloride: 102 mEq/L (ref 98–108)
Creat: 1 mg/dl (ref 0.6–1.2)
GLUCOSE: 85 mg/dL (ref 73–118)
POTASSIUM: 4.4 meq/L (ref 3.3–4.7)
Sodium: 134 mEq/L (ref 128–145)
Total Bilirubin: 1 mg/dl (ref 0.20–1.60)
Total Protein: 7.5 g/dL (ref 6.4–8.1)

## 2015-11-15 LAB — CBC WITH DIFFERENTIAL (CANCER CENTER ONLY)
BASO#: 0 10*3/uL (ref 0.0–0.2)
BASO%: 0.2 % (ref 0.0–2.0)
EOS ABS: 0.1 10*3/uL (ref 0.0–0.5)
EOS%: 2.2 % (ref 0.0–7.0)
HEMATOCRIT: 44.2 % (ref 38.7–49.9)
HEMOGLOBIN: 15.6 g/dL (ref 13.0–17.1)
LYMPH#: 1.6 10*3/uL (ref 0.9–3.3)
LYMPH%: 28.6 % (ref 14.0–48.0)
MCH: 29.3 pg (ref 28.0–33.4)
MCHC: 35.3 g/dL (ref 32.0–35.9)
MCV: 83 fL (ref 82–98)
MONO#: 0.5 10*3/uL (ref 0.1–0.9)
MONO%: 8.5 % (ref 0.0–13.0)
NEUT#: 3.4 10*3/uL (ref 1.5–6.5)
NEUT%: 60.5 % (ref 40.0–80.0)
Platelets: 172 10*3/uL (ref 145–400)
RBC: 5.32 10*6/uL (ref 4.20–5.70)
RDW: 13.7 % (ref 11.1–15.7)
WBC: 5.6 10*3/uL (ref 4.0–10.0)

## 2015-11-15 MED ORDER — SODIUM CHLORIDE 0.9 % IV SOLN
200.0000 mg | Freq: Once | INTRAVENOUS | Status: AC
  Administered 2015-11-15: 200 mg via INTRAVENOUS
  Filled 2015-11-15: qty 8

## 2015-11-15 MED ORDER — SODIUM CHLORIDE 0.9 % IV SOLN
Freq: Once | INTRAVENOUS | Status: AC
  Administered 2015-11-15: 12:00:00 via INTRAVENOUS

## 2015-11-15 MED ORDER — SODIUM CHLORIDE 0.9 % IV SOLN
200.0000 mg | Freq: Once | INTRAVENOUS | Status: DC
  Filled 2015-11-15: qty 8

## 2015-11-15 NOTE — Patient Instructions (Signed)
[facility] Discharge Instructions for Patients Receiving Chemotherapy  Today you received the following chemotherapy agents:  Keytruda  To help prevent nausea and vomiting after your treatment, we encourage you to take your nausea medications as directed on the bottle, we have sent a Rx to the [facility] for Compazine.   If you develop nausea and vomiting that is not controlled by your nausea medication, call the clinic.   BELOW ARE SYMPTOMS THAT SHOULD BE REPORTED IMMEDIATELY:  *FEVER GREATER THAN 100.5 F  *CHILLS WITH OR WITHOUT FEVER  NAUSEA AND VOMITING THAT IS NOT CONTROLLED WITH YOUR NAUSEA MEDICATION  *UNUSUAL SHORTNESS OF BREATH  *UNUSUAL BRUISING OR BLEEDING  TENDERNESS IN MOUTH AND THROAT WITH OR WITHOUT PRESENCE OF ULCERS  *URINARY PROBLEMS  *BOWEL PROBLEMS  UNUSUAL RASH Items with * indicate a potential emergency and should be followed up as soon as possible.  Feel free to call the clinic you have any questions or concerns. The clinic phone number is [phone].  Please show the [facility] at check-in to the Emergency Department and triage nurse.  Pembrolizumab injection What is this medicine? PEMBROLIZUMAB (pem broe liz ue mab) is a monoclonal antibody. It is used to treat melanoma and non-small cell lung cancer. This medicine may be used for other purposes; ask your health care provider or pharmacist if you have questions. What should I tell my health care provider before I take this medicine? They need to know if you have any of these conditions: -diabetes -immune system problems -inflammatory bowel disease -liver disease -lung or breathing disease -lupus -an unusual or allergic reaction to pembrolizumab, other medicines, foods, dyes, or preservatives -pregnant or trying to get pregnant -breast-feeding How should I use this medicine? This medicine is for infusion into a vein. It is given by a health care professional  in a hospital or clinic setting. A special MedGuide will be given to you before each treatment. Be sure to read this information carefully each time. Talk to your pediatrician regarding the use of this medicine in children. Special care may be needed. Overdosage: If you think you have taken too much of this medicine contact a poison control center or emergency room at once. NOTE: This medicine is only for you. Do not share this medicine with others. What if I miss a dose? It is important not to miss your dose. Call your doctor or health care professional if you are unable to keep an appointment. What may interact with this medicine? Interactions have not been studied. Give your health care provider a list of all the medicines, herbs, non-prescription drugs, or dietary supplements you use. Also tell them if you smoke, drink alcohol, or use illegal drugs. Some items may interact with your medicine. This list may not describe all possible interactions. Give your health care provider a list of all the medicines, herbs, non-prescription drugs, or dietary supplements you use. Also tell them if you smoke, drink alcohol, or use illegal drugs. Some items may interact with your medicine. What should I watch for while using this medicine? Your condition will be monitored carefully while you are receiving this medicine. You may need blood work done while you are taking this medicine. Do not become pregnant while taking this medicine or for 4 months after stopping it. Women should inform their doctor if they wish to become pregnant or think they might be pregnant. There is a potential for serious side effects to an unborn child. Talk  to your health care professional or pharmacist for more information. Do not breast-feed an infant while taking this medicine or for 4 months after the last dose. What side effects may I notice from receiving this medicine? Side effects that you should report to your doctor or health  care professional as soon as possible: -allergic reactions like skin rash, itching or hives, swelling of the face, lips, or tongue -bloody or black, tarry stools -breathing problems -change in the amount of urine -changes in vision -chest pain -chills -dark urine -dizziness or feeling faint or lightheaded -fast or irregular heartbeat -fever -flushing -hair loss -muscle pain -muscle weakness -persistent headache -signs and symptoms of high blood sugar such as dizziness; dry mouth; dry skin; fruity breath; nausea; stomach pain; increased hunger or thirst; increased urination -signs and symptoms of liver injury like dark urine, light-colored stools, loss of appetite, nausea, right upper belly pain, yellowing of the eyes or skin -stomach pain -weight loss Side effects that usually do not require medical attention (Report these to your doctor or health care professional if they continue or are bothersome.):constipation -cough -diarrhea -joint pain -tiredness This list may not describe all possible side effects. Call your doctor for medical advice about side effects. You may report side effects to FDA at 1-800-FDA-1088. Where should I keep my medicine? This drug is given in a hospital or clinic and will not be stored at home. NOTE: This sheet is a summary. It may not cover all possible information. If you have questions about this medicine, talk to your doctor, pharmacist, or health care provider.    2016, Elsevier/Gold Standard. (2014-07-06 17:24:19)

## 2015-11-15 NOTE — Progress Notes (Signed)
Hematology and Oncology Follow Up Visit  Kaplan Venegas [id number] 11/27/1960 55 y.o. 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 Nicolich is back for follow-up. His response has been nothing but dramatic. He feels like a new person now. He feels like his life is his again. He can pretty much do what he wants. There is no pain or swelling in the right arm any longer. There's no pain in the right arm.  He said about a week after he started, his arm felt better. He had less pain. He was able to go fishing. 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 He really has had much more energy. He really is quite excited as to how well he is done.  He has not noted any problems with cough or shortness of breath. He's had no diarrhea. He's had no rashes. In fact, he does have some areas of what appear to be vitiligo on his body which might be from the [place].  He's had no headache. He's had no dysphagia. He's had no fever. He's had no bleeding or bruising.  Overall, his performance status is ECOG 0.  Medications:  Current outpatient prescriptions:  Marland Kitchen  Multiple Vitamins-Minerals (MULTIVITAMIN GUMMIES ADULT PO), Take by mouth. Reported on 08/26/2015, Disp: , Rfl:  .  prochlorperazine (COMPAZINE) 10 MG tablet, Take 1 tablet (10 mg total) by mouth every 6 (six) hours as needed for nausea or vomiting., Disp: 30 tablet, Rfl: 1 No current facility-administered medications for this visit.  Facility-Administered Medications Ordered in Other Visits:  .  pembrolizumab (KEYTRUDA) 200 mg in sodium chloride 0.9 % 50 mL chemo infusion, 200 mg, Intravenous, Once, Volanda Napoleon, MD, Last Rate: 116 mL/hr at 11/15/15 1237, 200 mg at 11/15/15 1237  Allergies: No Known Allergies  Past Medical History, Surgical history, Social history, and Family  History were reviewed and updated.  Review of Systems: As abovecal Exam:  height is 6\' 1"  (1.854 m) and weight is 228 lb 1.9 oz (103.475 kg). His oral temperature is 97.9 F (36.6 C). His blood pressure is 123/76 and his pulse is 51. His respiration is 18.   Wt Readings from Last 3 Encounters:  11/15/15 228 lb 1.9 oz (103.475 kg)  08/26/15 236 lb (107.049 kg)  08/16/15 237 lb (107.502 kg)     Well-developed and well-nourished white male in no obvious distress. Head and neck exam shows no ocular or oral lesions. He has no adenopathy in the neck. He has no palpable thyroid. Lungs are clear bilaterally. Cardiac exam regular rate and rhythm with no murmurs, rubs or bruits. Axillary exam shows right axilla with some slight fullness in the posterior section. No distinct lymph nodes are noted. No swelling is noted. Left axilla is unremarkable. Abdomen is soft. He has good bowel sounds. There is no fluid wave. There is no palpable liver or spleen tip. Back exam shows no tenderness over the spine, ribs or hips. Extremities shows no clubbing, cyanosis or edema. There is no lymphedema of his right arm. Skin exam shows no rashes, ecchymoses or petechia.   Lab Results  Component Value Date   WBC 5.6 11/15/2015   HGB 15.6 11/15/2015   HCT 44.2 11/15/2015   MCV 83 11/15/2015   PLT 172 11/15/2015  Chemistry      Component Value Date/Time   NA 134 11/15/2015 1033   K 4.4 11/15/2015 1033   CL 102 11/15/2015 1033   CO2 29 11/15/2015 1033   BUN 16 11/15/2015 1033   CREATININE 1.0 11/15/2015 1033      Component Value Date/Time   CALCIUM 9.6 11/15/2015 1033   ALKPHOS 70 11/15/2015 1033   AST 27 11/15/2015 1033   ALT 31 11/15/2015 1033   BILITOT 1.00 11/15/2015 1033         Impression and Plan: Mr. Kuka is a 55 year old white male. He presented with right axillary lymphadenopathy. Biopsies showed melanoma. A primary site was not found. He had a PET scan which first saw him. The PET scan  did not show any evidence of metastatic disease or a primary site. He does had uptake in the right axilla.  Clearly, his response has been incredibly dramatic. His quality of life is much better now. He really is incredibly happy and blast as to how well he is done. As always, he gives total credit to God. His faith has remained quite strong.    after this cycle of Keytruda, we will go ahead and plan for follow-up CT scan and a CT PET scan. This will allow the surgeon to better plan for surgical resection. Hopefully, because his response, there will not be any complications from surgery with respect to neurological damage.  After surgery, I still feel he probably will need radiation therapy. We'll see about that depending on the surgical results.  I spoke to his surgeon, he will try to get him seen the end of July so that surgery can be done the first week in August. This will allow Mr. 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  I will add to see him back in about 6 or 7 weeks. By then, he will had surgery and would have had a nice recovery so we can talk about radiation.  I spent about 40 minutes with he and his wife.   Volanda Napoleon, MD 6/27/201712:47 PM

## 2015-12-01 ENCOUNTER — Encounter (HOSPITAL_COMMUNITY): Payer: Self-pay

## 2015-12-01 ENCOUNTER — Ambulatory Visit (HOSPITAL_COMMUNITY): Payer: Self-pay

## 2015-12-06 ENCOUNTER — Other Ambulatory Visit: Payer: Self-pay

## 2015-12-06 ENCOUNTER — Ambulatory Visit: Payer: PRIVATE HEALTH INSURANCE

## 2015-12-06 ENCOUNTER — Ambulatory Visit: Payer: Self-pay | Admitting: Family

## 2015-12-09 ENCOUNTER — Other Ambulatory Visit: Payer: Self-pay | Admitting: Family

## 2015-12-09 DIAGNOSIS — R222 Localized swelling, mass and lump, trunk: Secondary | ICD-10-CM

## 2015-12-09 DIAGNOSIS — C4359 Malignant melanoma of other part of trunk: Secondary | ICD-10-CM

## 2015-12-12 ENCOUNTER — Ambulatory Visit (HOSPITAL_COMMUNITY)
Admission: RE | Admit: 2015-12-12 | Discharge: 2015-12-12 | Disposition: A | Discharge status: Home / Self Care | Payer: Self-pay | Source: Ambulatory Visit | Attending: Hematology & Oncology | Admitting: Hematology & Oncology

## 2015-12-12 ENCOUNTER — Encounter (HOSPITAL_COMMUNITY): Payer: Self-pay

## 2015-12-12 ENCOUNTER — Telehealth: Payer: Self-pay | Admitting: Family

## 2015-12-12 ENCOUNTER — Ambulatory Visit (HOSPITAL_COMMUNITY)
Admission: RE | Admit: 2015-12-12 | Discharge: 2015-12-12 | Disposition: A | Discharge status: Home / Self Care | Payer: Self-pay | Source: Ambulatory Visit | Attending: Family | Admitting: Family

## 2015-12-12 DIAGNOSIS — R222 Localized swelling, mass and lump, trunk: Secondary | ICD-10-CM | POA: Insufficient documentation

## 2015-12-12 DIAGNOSIS — C779 Secondary and unspecified malignant neoplasm of lymph node, unspecified: Secondary | ICD-10-CM | POA: Insufficient documentation

## 2015-12-12 DIAGNOSIS — Z9889 Other specified postprocedural states: Secondary | ICD-10-CM | POA: Insufficient documentation

## 2015-12-12 DIAGNOSIS — C4359 Malignant melanoma of other part of trunk: Secondary | ICD-10-CM | POA: Insufficient documentation

## 2015-12-12 LAB — GLUCOSE, CAPILLARY: Glucose-Capillary: 105 mg/dL — ABNORMAL HIGH (ref 65–99)

## 2015-12-12 MED ORDER — FLUDEOXYGLUCOSE F - 18 (FDG) INJECTION
11.8000 | Freq: Once | INTRAVENOUS | Status: AC | PRN
  Administered 2015-12-12: 11.8 via INTRAVENOUS

## 2015-12-12 NOTE — Telephone Encounter (Signed)
dsfsdf

## 2015-12-12 NOTE — Care Management Note (Signed)
Spoke with Jonathan Hayes and went over PET scan results. Also sent results to Dr. Georgette Dover. He had a nice response to treatment and had decrease in size and hypermetabolism involving the right subpectoral/axillary lymph nodes. He will follow-up with Dr. Marin Olp in August. He will wait to hear from Dr. Vonna Kotyk office regarding surgery. All questions were answered and he will contact us with any other concerns. We can certainly see him sooner if need be.

## 2015-12-26 ENCOUNTER — Ambulatory Visit: Payer: Self-pay | Admitting: Surgery

## 2015-12-26 NOTE — H&P (Signed)
Patient words: f/u.  The patient is a 55 year old male who presents for a melanoma biopsy follow-up. This is a 55 year old male in good health who is fairly active who initially presented in March 2017 with about a 3 month history of a fairly rapidly enlarging right chest wall mass. His wife first palpated it about 3 months before and it was only a couple centimeters across. It grew to over 10 cm across. Workup revealed a diagnosis of malignant melanoma with unknown primary. He has undergone treatment with Keytruda under the care of Dr. Marin Olp and has shown a dramatic improvement. He no longer has any right arm swelling which she had at initial presentation. He has full use of his arm and has resumed full activity. He underwent a recent CT scan that showed significant decrease in the size of the enlarged lymph nodes. He presents now to discuss axillary lymph node dissection. I had a discussion with Dr. Marin Olp on 11/15/15 and our plan was to try to get him into the office to discuss surgery in late July with surgery to be done in early August. However the patient went to the beach for the entire month of July. He did have a CT scan but he presents now for his preoperative visit.  CLINICAL DATA: Restaging melanoma, oral chemotherapy ongoing. Right axillary mass. EXAM: CT CHEST WITH CONTRAST TECHNIQUE: Multidetector CT imaging of the chest was performed during intravenous contrast administration. CONTRAST: 75 cc Omnipaque 300. COMPARISON: PET 12/12/2015 and 08/15/2015. CT chest 07/22/2015. FINDINGS: Cardiovascular: Vascular structures are unremarkable. Heart size normal. No pericardial effusion. Mediastinum/Nodes: No pathologically enlarged mediastinal, hilar or left axillary lymph nodes. Right axillary adenopathy measures up to 1.4 x 3.4 cm, previously 4.2 x 6.5 cm. High right subpectoral lymph node measures 6 mm, previously 2.6 cm. Esophagus is  grossly unremarkable. Lungs/Pleura: Minimal biapical pleural parenchymal scarring. Lungs are otherwise clear. No pleural fluid. Airway is unremarkable. Upper Abdomen: Low-attenuation lesions in the liver measure up to 1.3 cm, stable. Visualized portions of the liver, gallbladder, adrenal glands, kidneys, spleen, pancreas, stomach and bowel are otherwise grossly unremarkable. Gastrohepatic ligament lymph node measures 10 mm, stable. Musculoskeletal: No worrisome lytic or sclerotic lesions. Degenerative changes are seen in the spine. IMPRESSION: Marked decrease in size of right subpectoral and right axillary adenopathy without complete resolution. Electronically Signed: By: Lorin Picket M.D. On: 12/12/2015 14:06  CLINICAL DATA: Subsequent treatment strategy for melanoma. EXAM: NUCLEAR MEDICINE PET WHOLE BODY TECHNIQUE: 11.8 mCi F-18 FDG was injected intravenously. Full-ring PET imaging was performed from the vertex to the feet after the radiotracer. CT data was obtained and used for attenuation correction and anatomic localization. FASTING BLOOD GLUCOSE: Value: 105 mg/dl COMPARISON: CT chest 12/12/2015, PET 08/15/2015 and CT chest 07/22/2015. FINDINGS: Head/Neck: No hypermetabolic lymph nodes in the neck. CT images show no acute findings. Chest: Hypermetabolic right subpectoral and right axillary lymph nodes have decreased in size. Largest lymph node measures 1.3 x 2.7 cm in the right axilla with an SUV max of 8.3, compared to 5.1 x 7.1 cm and SUV max of 29.3 on 08/15/2015. No hypermetabolic mediastinal, hilar or left axillary lymph nodes. No hypermetabolic pulmonary nodules. CT images show no pericardial or pleural effusion. Abdomen/Pelvis: No abnormal hypermetabolism in the liver, adrenal glands, spleen or pancreas. No hypermetabolic lymph nodes. Low-attenuation lesions in the liver measure up to 10 mm in the left hepatic lobe, as before. Liver, gallbladder, adrenal  glands, kidneys, spleen, pancreas, stomach and bowel are otherwise grossly unremarkable.  Skeleton: No abnormal osseous hypermetabolism. Extremities: No hypermetabolic activity to suggest metastasis. IMPRESSION: Interval response to therapy as evidenced by decrease in size and hypermetabolism involving right subpectoral/ axillary lymph nodes, without complete resolution. Electronically Signed By: Lorin Picket M.D. On: 12/12/2015 14:06   Problem List/Past Medical Maia Petties, MD; 12/26/2015 1:13 PM) MALIGNANT MELANOMA OF AXILLA (C43.59) MASS OF RIGHT CHEST WALL (R22.2)  Other Problems Maia Petties, MD; 12/26/2015 1:13 PM) Hemorrhoids Gastroesophageal Reflux Disease  Past Surgical History Maia Petties, MD; 12/26/2015 1:13 PM) Tonsillectomy  Diagnostic Studies History Maia Petties, MD; 12/26/2015 1:13 PM) Colonoscopy 1-5 years ago  Allergies Malachi Bonds, CMA; 12/26/2015 8:26 AM) No Known Drug Allergies 07/22/2015  Medication History (Chemira Jones, CMA; 12/26/2015 8:26 AM) Ketoconazole (2% Cream, External) Active. Triamcinolone Acetonide (0.5% Cream, External) Active. Fluconazole (100MG  Tablet, Oral) Active. Medications Reconciled    Vitals (Chemira Jones CMA; 12/26/2015 8:26 AM) 12/26/2015 8:25 AM Weight: 234 lb Height: 73in Body Surface Area: 2.3 m Body Mass Index: 30.87 kg/m  Temp.: 97.33F(Oral)  Pulse: 56 (Regular)  BP: 110/78 (Sitting, Left Arm, Standard)      Physical Exam Rodman Key K. Ronzell Laban MD; 12/26/2015 1:14 PM)  The physical exam findings are as follows: Note:WDWN in NAD HEENT: EOMI, sclera anicteric Neck: No masses, no thyromegaly Lungs: CTA bilaterally; normal respiratory effort Right axilla - slight fullness but no palpable dominant lymphadenopathy CV: Regular rate and rhythm; no murmurs Abd: +bowel sounds, soft, non-tender, no masses Ext: Well-perfused; no arm swelling noted today Skin: Warm, dry; no sign of  jaundice    Assessment & Plan Rodman Key K. Kaeden Mester MD; 12/26/2015 1:15 PM)  MALIGNANT MELANOMA OF AXILLA (C43.59)  Current Plans Schedule for Surgery - Right axillary lymph node dissection. The surgical procedure has been discussed with the patient. Potential risks, benefits, alternative treatments, and expected outcomes have been explained. All of the patient's questions at this time have been answered. The likelihood of reaching the patient's treatment goal is good. The patient understand the proposed surgical procedure and wishes to proceed.  We discussed the possible side effects of axillary lymph node dissection including nerve damage and lymphedema. The patient had arm swelling at his initial presentation in March of this year but this seems to have resolved with the decrease in the size of the lymphadenopathy. He understands the risks of surgery but also understands the need for a lymph node dissection with possible adjuvant radiation.  Imogene Burn. Georgette Dover, MD, [facility]  General/ Trauma Surgery  12/26/2015 1:15 PM

## 2015-12-28 ENCOUNTER — Other Ambulatory Visit: Payer: Self-pay

## 2016-01-04 ENCOUNTER — Ambulatory Visit (HOSPITAL_BASED_OUTPATIENT_CLINIC_OR_DEPARTMENT_OTHER): Payer: Self-pay

## 2016-01-04 ENCOUNTER — Other Ambulatory Visit (HOSPITAL_BASED_OUTPATIENT_CLINIC_OR_DEPARTMENT_OTHER): Payer: Self-pay

## 2016-01-04 ENCOUNTER — Other Ambulatory Visit: Payer: Self-pay | Admitting: Nurse Practitioner

## 2016-01-04 ENCOUNTER — Ambulatory Visit (HOSPITAL_BASED_OUTPATIENT_CLINIC_OR_DEPARTMENT_OTHER): Payer: Self-pay | Admitting: Hematology & Oncology

## 2016-01-04 VITALS — BP 120/83 | HR 56 | Temp 97.8°F | Resp 20 | Wt 233.0 lb

## 2016-01-04 DIAGNOSIS — C4359 Malignant melanoma of other part of trunk: Secondary | ICD-10-CM

## 2016-01-04 DIAGNOSIS — Z79899 Other long term (current) drug therapy: Secondary | ICD-10-CM

## 2016-01-04 DIAGNOSIS — Z5112 Encounter for antineoplastic immunotherapy: Secondary | ICD-10-CM

## 2016-01-04 DIAGNOSIS — R222 Localized swelling, mass and lump, trunk: Secondary | ICD-10-CM

## 2016-01-04 LAB — CBC WITH DIFFERENTIAL (CANCER CENTER ONLY)
BASO#: 0 10*3/uL (ref 0.0–0.2)
BASO%: 0.2 % (ref 0.0–2.0)
EOS ABS: 0.1 10*3/uL (ref 0.0–0.5)
EOS%: 1.8 % (ref 0.0–7.0)
HEMATOCRIT: 41.5 % (ref 38.7–49.9)
HEMOGLOBIN: 14.6 g/dL (ref 13.0–17.1)
LYMPH#: 1.3 10*3/uL (ref 0.9–3.3)
LYMPH%: 28.8 % (ref 14.0–48.0)
MCH: 29.7 pg (ref 28.0–33.4)
MCHC: 35.2 g/dL (ref 32.0–35.9)
MCV: 85 fL (ref 82–98)
MONO#: 0.5 10*3/uL (ref 0.1–0.9)
MONO%: 10 % (ref 0.0–13.0)
NEUT%: 59.2 % (ref 40.0–80.0)
NEUTROS ABS: 2.7 10*3/uL (ref 1.5–6.5)
Platelets: 157 10*3/uL (ref 145–400)
RBC: 4.91 10*6/uL (ref 4.20–5.70)
RDW: 14.1 % (ref 11.1–15.7)
WBC: 4.5 10*3/uL (ref 4.0–10.0)

## 2016-01-04 LAB — LACTATE DEHYDROGENASE: LDH: 222 U/L (ref 125–245)

## 2016-01-04 LAB — CMP (CANCER CENTER ONLY)
ALBUMIN: 3.8 g/dL (ref 3.3–5.5)
ALT(SGPT): 27 U/L (ref 10–47)
AST: 27 U/L (ref 11–38)
Alkaline Phosphatase: 71 U/L (ref 26–84)
BUN, Bld: 9 mg/dL (ref 7–22)
CALCIUM: 9.3 mg/dL (ref 8.0–10.3)
CHLORIDE: 106 meq/L (ref 98–108)
CO2: 23 meq/L (ref 18–33)
Creat: 1.2 mg/dl (ref 0.6–1.2)
Glucose, Bld: 122 mg/dL — ABNORMAL HIGH (ref 73–118)
POTASSIUM: 4.2 meq/L (ref 3.3–4.7)
Sodium: 132 mEq/L (ref 128–145)
TOTAL PROTEIN: 7.2 g/dL (ref 6.4–8.1)
Total Bilirubin: 0.9 mg/dl (ref 0.20–1.60)

## 2016-01-04 LAB — TSH: TSH: 1.368 m(IU)/L (ref 0.320–4.118)

## 2016-01-04 MED ORDER — SODIUM CHLORIDE 0.9 % IV SOLN
200.0000 mg | Freq: Once | INTRAVENOUS | Status: AC
  Administered 2016-01-04: 200 mg via INTRAVENOUS
  Filled 2016-01-04: qty 8

## 2016-01-04 MED ORDER — SODIUM CHLORIDE 0.9 % IV SOLN
Freq: Once | INTRAVENOUS | Status: AC
  Administered 2016-01-04: 14:00:00 via INTRAVENOUS

## 2016-01-04 NOTE — Patient Instructions (Signed)
Pembrolizumab injection What is this medicine? PEMBROLIZUMAB (pem broe liz ue mab) is a monoclonal antibody. It is used to treat melanoma and non-small cell lung cancer. This medicine may be used for other purposes; ask your health care provider or pharmacist if you have questions. What should I tell my health care provider before I take this medicine? They need to know if you have any of these conditions: -diabetes -immune system problems -inflammatory bowel disease -liver disease -lung or breathing disease -lupus -an unusual or allergic reaction to pembrolizumab, other medicines, foods, dyes, or preservatives -pregnant or trying to get pregnant -breast-feeding How should I use this medicine? This medicine is for infusion into a vein. It is given by a health care professional in a hospital or clinic setting. A special MedGuide will be given to you before each treatment. Be sure to read this information carefully each time. Talk to your pediatrician regarding the use of this medicine in children. Special care may be needed. Overdosage: If you think you have taken too much of this medicine contact a poison control center or emergency room at once. NOTE: This medicine is only for you. Do not share this medicine with others. What if I miss a dose? It is important not to miss your dose. Call your doctor or health care professional if you are unable to keep an appointment. What may interact with this medicine? Interactions have not been studied. Give your health care provider a list of all the medicines, herbs, non-prescription drugs, or dietary supplements you use. Also tell them if you smoke, drink alcohol, or use illegal drugs. Some items may interact with your medicine. This list may not describe all possible interactions. Give your health care provider a list of all the medicines, herbs, non-prescription drugs, or dietary supplements you use. Also tell them if you smoke, drink alcohol, or  use illegal drugs. Some items may interact with your medicine. What should I watch for while using this medicine? Your condition will be monitored carefully while you are receiving this medicine. You may need blood work done while you are taking this medicine. Do not become pregnant while taking this medicine or for 4 months after stopping it. Women should inform their doctor if they wish to become pregnant or think they might be pregnant. There is a potential for serious side effects to an unborn child. Talk to your health care professional or pharmacist for more information. Do not breast-feed an infant while taking this medicine or for 4 months after the last dose. What side effects may I notice from receiving this medicine? Side effects that you should report to your doctor or health care professional as soon as possible: -allergic reactions like skin rash, itching or hives, swelling of the face, lips, or tongue -bloody or black, tarry stools -breathing problems -change in the amount of urine -changes in vision -chest pain -chills -dark urine -dizziness or feeling faint or lightheaded -fast or irregular heartbeat -fever -flushing -hair loss -muscle pain -muscle weakness -persistent headache -signs and symptoms of high blood sugar such as dizziness; dry mouth; dry skin; fruity breath; nausea; stomach pain; increased hunger or thirst; increased urination -signs and symptoms of liver injury like dark urine, light-colored stools, loss of appetite, nausea, right upper belly pain, yellowing of the eyes or skin -stomach pain -weight loss Side effects that usually do not require medical attention (Report these to your doctor or health care professional if they continue or are bothersome.):constipation -cough -diarrhea -joint pain -  tiredness This list may not describe all possible side effects. Call your doctor for medical advice about side effects. You may report side effects to FDA at  1-800-FDA-1088. Where should I keep my medicine? This drug is given in a hospital or clinic and will not be stored at home. NOTE: This sheet is a summary. It may not cover all possible information. If you have questions about this medicine, talk to your doctor, pharmacist, or health care provider.    2016, Elsevier/Gold Standard. (2014-07-06 17:24:19)  

## 2016-01-04 NOTE — Progress Notes (Signed)
Hematology and Oncology Follow Up Visit  Demetrie Potosky [id number] 1960/10/21 55 y.o. 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 Klever is back for follow-up. He is having his surgery on August 29. He really cannot have it before then because of obligations that he has with respect to school and with his other jobs.  He feels great. He's had no pain with the right arm. He's had no cough or shortness of breath. He's had no fatigue or weakness. He's had no nausea or vomiting. There's been no change in bowel or bladder habits.  Overall, his performance status is ECOG 0.  Medications:  Current Outpatient Prescriptions:  Marland Kitchen  Multiple Vitamins-Minerals (MULTIVITAMIN GUMMIES ADULT PO), Take by mouth. Reported on 08/26/2015, Disp: , Rfl:  .  prochlorperazine (COMPAZINE) 10 MG tablet, Take 1 tablet (10 mg total) by mouth every 6 (six) hours as needed for nausea or vomiting. (Patient not taking: Reported on 01/04/2016), Disp: 30 tablet, Rfl: 1  Allergies: No Known Allergies  Past Medical History, Surgical history, Social history, and Family History were reviewed and updated.  Review of Systems: As abovecal Exam:  weight is 233 lb (105.7 kg). His oral temperature is 97.8 F (36.6 C). His blood pressure is 120/83 and his pulse is 56 (abnormal). His respiration is 20.   Wt Readings from Last 3 Encounters:  01/04/16 233 lb (105.7 kg)  11/15/15 228 lb 1.9 oz (103.5 kg)  08/26/15 236 lb (107 kg)     Well-developed and well-nourished white male in no obvious distress. Head and neck exam shows no ocular or oral lesions. He has no adenopathy in the neck. He has no palpable thyroid. Lungs are clear bilaterally. Cardiac exam regular rate and rhythm with no murmurs, rubs or bruits. Axillary exam shows right axilla with some slight fullness in the posterior section. No  distinct lymph nodes are noted. No swelling is noted. Left axilla is unremarkable. Abdomen is soft. He has good bowel sounds. There is no fluid wave. There is no palpable liver or spleen tip. Back exam shows no tenderness over the spine, ribs or hips. Extremities shows no clubbing, cyanosis or edema. There is no lymphedema of his right arm. Skin exam shows no rashes, ecchymoses or petechia.   Lab Results  Component Value Date   WBC 4.5 01/04/2016   HGB 14.6 01/04/2016   HCT 41.5 01/04/2016   MCV 85 01/04/2016   PLT 157 01/04/2016     Chemistry      Component Value Date/Time   NA 132 01/04/2016 1115   K 4.2 01/04/2016 1115   CL 106 01/04/2016 1115   CO2 23 01/04/2016 1115   BUN 9 01/04/2016 1115   CREATININE 1.2 01/04/2016 1115      Component Value Date/Time   CALCIUM 9.3 01/04/2016 1115   ALKPHOS 71 01/04/2016 1115   AST 27 01/04/2016 1115   ALT 27 01/04/2016 1115   BILITOT 0.90 01/04/2016 1115         Impression and Plan: Mr. Rierson is a 55 year old white male. He presented with right axillary lymphadenopathy. Biopsies showed melanoma. A primary site was not found. He had a PET scan which first saw him. The PET scan did not show any evidence of metastatic disease or a primary site. He does had uptake in the right axilla.  Clearly, his response has been incredibly dramatic. His quality of life is much better now. He really is incredibly happy and blast as to how well he is done. As always, he gives total credit to God. His faith has remained quite strong.    I will going give him one final cycle of treatment. The [facility] has really done a good job for him. I wanted try to make sure he has this on board.  I taught him about the fact that he will need radiation therapy after surgery. I'm sure that he will still have residual disease in the axilla. I know that Dr. Georgette Dover will do a great job and try to resect out all he can. However, I just have a sense that there will be residual  disease. Radiation is incredibly effective in eradicating that so that there is minimal risk of recurrence in the axilla.  I'm just glad that he is done so well.  I probably will see him back in the office in about 6 weeks area and by then, we will see how well he is healed up. Will have the pathology report back and then we can talk to about radiation.   Volanda Napoleon, MD 8/16/20176:19 PM

## 2016-01-09 ENCOUNTER — Encounter (HOSPITAL_COMMUNITY): Payer: Self-pay

## 2016-01-10 ENCOUNTER — Encounter (HOSPITAL_COMMUNITY)
Admission: RE | Admit: 2016-01-10 | Discharge: 2016-01-10 | Disposition: A | Discharge status: Home / Self Care | Payer: Self-pay | Source: Ambulatory Visit | Attending: Surgery | Admitting: Surgery

## 2016-01-10 ENCOUNTER — Encounter (HOSPITAL_COMMUNITY): Payer: Self-pay

## 2016-01-10 DIAGNOSIS — C439 Malignant melanoma of skin, unspecified: Secondary | ICD-10-CM | POA: Insufficient documentation

## 2016-01-10 DIAGNOSIS — Z01818 Encounter for other preprocedural examination: Secondary | ICD-10-CM | POA: Insufficient documentation

## 2016-01-10 DIAGNOSIS — Z01812 Encounter for preprocedural laboratory examination: Secondary | ICD-10-CM | POA: Insufficient documentation

## 2016-01-10 HISTORY — DX: Other complications of anesthesia, initial encounter: T88.59XA

## 2016-01-10 HISTORY — DX: Malignant (primary) neoplasm, unspecified: C80.1

## 2016-01-10 HISTORY — DX: Adverse effect of unspecified anesthetic, initial encounter: T41.45XA

## 2016-01-10 LAB — BASIC METABOLIC PANEL
Anion gap: 7 (ref 5–15)
BUN: 10 mg/dL (ref 6–20)
CHLORIDE: 107 mmol/L (ref 101–111)
CO2: 24 mmol/L (ref 22–32)
CREATININE: 0.99 mg/dL (ref 0.61–1.24)
Calcium: 9.2 mg/dL (ref 8.9–10.3)
GFR calc non Af Amer: >60 mL/min (ref >60)
Glucose, Bld: 102 mg/dL — ABNORMAL HIGH (ref 65–99)
POTASSIUM: 3.8 mmol/L (ref 3.5–5.1)
SODIUM: 138 mmol/L (ref 135–145)

## 2016-01-10 LAB — CBC
HCT: 41.2 % (ref 39.0–52.0)
HEMOGLOBIN: 14.1 g/dL (ref 13.0–17.0)
MCH: 29.4 pg (ref 26.0–34.0)
MCHC: 34.2 g/dL (ref 30.0–36.0)
MCV: 86 fL (ref 78.0–100.0)
PLATELETS: 155 10*3/uL (ref 150–400)
RBC: 4.79 MIL/uL (ref 4.22–5.81)
RDW: 13.7 % (ref 11.5–15.5)
WBC: 5.2 10*3/uL (ref 4.0–10.5)

## 2016-01-10 NOTE — Progress Notes (Signed)
Spoke with Myra Gianotti about patient having 2 weeks of hoarseness and "feeling like the tube was ripped out of his throat" after ACL surgery at [facility].  Requested anesthesia records from [facility].

## 2016-01-10 NOTE — Pre-Procedure Instructions (Signed)
Jonathan Hayes  01/10/2016      [facility] [phone] - [place] - [address] [place] [address] Phone: [phone] Fax: [phone]  [place], [place] - [place] [address] [place] [address] Phone: [phone] Fax: [phone]    Your procedure is scheduled on  Tuesday  01/17/16  Report to [facility] Admitting at 530 A.M.  Call this number if you have problems the morning of surgery:  [phone]   Remember:  Do not eat food or drink liquids after midnight.  Take these medicines the morning of surgery with A SIP OF WATER  TYLENOL IF NEEDED  (STOP NOW ASPIRIN OR ASPIRIN PRODUCTS, IBUPROFEN/ MOTRIN/ ADVIL, GOODY POWDERS/ BC'S, HERBAL MEDICINES)   Do not wear jewelry, make-up or nail polish.  Do not wear lotions, powders, or perfumes.  You may wear deoderant.  Do not shave 48 hours prior to surgery.  Men may shave face and neck.  Do not bring valuables to the hospital.  [facility] is not responsible for any belongings or valuables.  Contacts, dentures or bridgework may not be worn into surgery.  Leave your suitcase in the car.  After surgery it may be brought to your room.  For patients admitted to the hospital, discharge time will be determined by your treatment team.  Patients discharged the day of surgery will not be allowed to drive home.   Name and phone number of your driver:    Special instructions:  [place] - Preparing for Surgery  Before surgery, you can play an important role.  Because skin is not sterile, your skin needs to be as free of germs as possible.  You can reduce the number of germs on you skin by washing with CHG (chlorahexidine gluconate) soap before surgery.  CHG is an antiseptic cleaner which kills germs and bonds with the skin to continue killing germs even after washing.  Please DO NOT  use if you have an allergy to CHG or antibacterial soaps.  If your skin becomes reddened/irritated stop using the CHG and inform your nurse when you arrive at Short Stay.  Do not shave (including legs and underarms) for at least 48 hours prior to the first CHG shower.  You may shave your face.  Please follow these instructions carefully:   1.  Shower with CHG Soap the night before surgery and the                                morning of Surgery.  2.  If you choose to wash your hair, wash your hair first as usual with your       normal shampoo.  3.  After you shampoo, rinse your hair and body thoroughly to remove the                      Shampoo.  4.  Use CHG as you would any other liquid soap.  You can apply chg directly       to the skin and wash gently with scrungie or a clean washcloth.  5.  Apply the CHG Soap to your body ONLY FROM THE NECK DOWN.        Do not use on open wounds  or open sores.  Avoid contact with your eyes,       ears, mouth and genitals (private parts).  Wash genitals (private parts)       with your normal soap.  6.  Wash thoroughly, paying special attention to the area where your surgery        will be performed.  7.  Thoroughly rinse your body with warm water from the neck down.  8.  DO NOT shower/wash with your normal soap after using and rinsing off       the CHG Soap.  9.  Pat yourself dry with a clean towel.            10.  Wear clean pajamas.            11.  Place clean sheets on your bed the night of your first shower and do not        sleep with pets.  Day of Surgery  Do not apply any lotions/deoderants the morning of surgery.  Please wear clean clothes to the hospital/surgery center.    Please read over the following fact sheets that you were given. Pain Booklet and Surgical Site Infection Prevention

## 2016-01-11 NOTE — Progress Notes (Addendum)
Anesthesia Chart Review: Patient is a 55 year old male scheduled for right axillary lymph node dissection on 01/17/16 by Dr. Georgette Dover. He has known stage III melanoma. He initially presented with right axillary lymphadenopathy. Biopsy showed melanoma. A primary site was not found. The PET scan did not show any evidence of metastatic disease or primary site. He did have uptake in the right axilla. He may ultimately need radiation therapy following surgery, but oncology will await pathology report.  History includes stage III malignant melanoma s/p 5 cycles of Keytruda, non-smoker, tonsillectomy, hernia repair (as infant), bilateral knee arthroscopies, ankle surgery. Patient denied known CAD. He is a [employer] and stays active.  For anesthesia history, he reported that after one of his ACL repairs (at least 13 years ago at [facility]) he remembered it hurting when the ETT was pulled out (felt like the "tube was ripped out of my throat"), and he had hoarseness and sore throat for two weeks. However, he denied history of difficult intubation.  PCP is listed as Dr. Iverson Alamin.  Oncologist is Dr. Marin Olp.  BP 133/78   Pulse 68   Temp 36.4 C   Resp 20   Ht 6\' 1"  (1.854 m)   Wt 232 lb 9.6 oz (105.5 kg)   SpO2 97%   BMI 30.69 kg/m   12/12/15 Chest CT: FINDINGS: - Cardiovascular: Vascular structures are unremarkable. Heart size normal. No pericardial effusion. - Mediastinum/Nodes: No pathologically enlarged mediastinal, hilar or left axillary lymph nodes. Right axillary adenopathy measures up to 1.4 x 3.4 cm, previously 4.2 x 6.5 cm. High right subpectoral lymph node measures 6 mm, previously 2.6 cm. Esophagus is grossly unremarkable. - Lungs/Pleura: Minimal biapical pleural parenchymal scarring. Lungs are otherwise clear. No pleural fluid. Airway is unremarkable. - Upper Abdomen: Low-attenuation lesions in the liver measure up to 1.3 cm, stable. Visualized portions of the liver, gallbladder,  adrenal glands, kidneys, spleen, pancreas, stomach and bowel are otherwise grossly unremarkable. Gastrohepatic ligament lymph node measures 10 mm, stable. - Musculoskeletal: No worrisome lytic or sclerotic lesions. Degenerative changes are seen in the spine. IMPRESSION: Marked decrease in size of right subpectoral and right axillary adenopathy without complete resolution.  Preoperative labs noted.   His last two anesthesia records from [facility] requested as available (since > 10 years ago). I'll plan to review if received. At this point it is unclear why it hurt so badly when the ETT was removed. Musculoskeletal tension from being "awake" while still intubated? Tube size? Cuff issues? He otherwise has not had any other issues with anesthesia/inutabion. Further evaluation by his anesthesiologist on the day of surgery to discuss definitive plan.  [facility] Short Stay Center/Anesthesiology Phone [phone] 01/11/2016 4:05 PM  Addendum: [facility] medical records had no anesthesia records dating back to 2004. I re-requested records past 2004 (if available) since patient reported surgeries were > 13 years ago. They reported only ED visit from 2010 available. I also contacted [facility], but was told their records are also kept at [facility].  [facility] Short Stay Center/Anesthesiology Phone [phone] 01/13/2016 11:19 AM

## 2016-01-16 MED ORDER — CEFAZOLIN SODIUM-DEXTROSE 2-4 GM/100ML-% IV SOLN
2.0000 g | INTRAVENOUS | Status: AC
  Administered 2016-01-17: 2 g via INTRAVENOUS
  Filled 2016-01-16: qty 100

## 2016-01-17 ENCOUNTER — Encounter (HOSPITAL_COMMUNITY)
Admission: RE | Disposition: A | Discharge status: Home / Self Care | Payer: Self-pay | Source: Ambulatory Visit | Attending: Surgery

## 2016-01-17 ENCOUNTER — Ambulatory Visit (HOSPITAL_COMMUNITY): Payer: Self-pay | Admitting: Vascular Surgery

## 2016-01-17 ENCOUNTER — Encounter (HOSPITAL_COMMUNITY): Payer: Self-pay | Admitting: *Deleted

## 2016-01-17 ENCOUNTER — Ambulatory Visit (HOSPITAL_COMMUNITY)
Admission: RE | Admit: 2016-01-17 | Discharge: 2016-01-17 | Disposition: A | Discharge status: Home / Self Care | Payer: Self-pay | Source: Ambulatory Visit | Attending: Surgery | Admitting: Surgery

## 2016-01-17 DIAGNOSIS — C439 Malignant melanoma of skin, unspecified: Secondary | ICD-10-CM | POA: Insufficient documentation

## 2016-01-17 DIAGNOSIS — C773 Secondary and unspecified malignant neoplasm of axilla and upper limb lymph nodes: Secondary | ICD-10-CM | POA: Insufficient documentation

## 2016-01-17 HISTORY — PX: AXILLARY LYMPH NODE DISSECTION: SHX5229

## 2016-01-17 SURGERY — LYMPHADENECTOMY, AXILLARY
Anesthesia: General | Site: Axilla | Laterality: Right

## 2016-01-17 MED ORDER — ONDANSETRON HCL 4 MG/2ML IJ SOLN
INTRAMUSCULAR | Status: DC | PRN
  Administered 2016-01-17: 4 mg via INTRAVENOUS

## 2016-01-17 MED ORDER — HEMOSTATIC AGENTS (NO CHARGE) OPTIME
TOPICAL | Status: DC | PRN
  Administered 2016-01-17: 1 via TOPICAL

## 2016-01-17 MED ORDER — BUPIVACAINE-EPINEPHRINE 0.25% -1:200000 IJ SOLN
INTRAMUSCULAR | Status: DC | PRN
  Administered 2016-01-17: 30 mL

## 2016-01-17 MED ORDER — OXYCODONE-ACETAMINOPHEN 5-325 MG PO TABS: 1.0000 | ORAL_TABLET | ORAL | 0 refills | Status: DC | PRN

## 2016-01-17 MED ORDER — HYDROMORPHONE HCL 1 MG/ML IJ SOLN
INTRAMUSCULAR | Status: DC
Start: 2016-01-17 — End: 2016-01-17
  Filled 2016-01-17: qty 1

## 2016-01-17 MED ORDER — PROPOFOL 10 MG/ML IV BOLUS
INTRAVENOUS | Status: AC
  Filled 2016-01-17: qty 20

## 2016-01-17 MED ORDER — MIDAZOLAM HCL 5 MG/5ML IJ SOLN
INTRAMUSCULAR | Status: DC | PRN
  Administered 2016-01-17: 2 mg via INTRAVENOUS

## 2016-01-17 MED ORDER — CHLORHEXIDINE GLUCONATE CLOTH 2 % EX PADS: 6.0000 | MEDICATED_PAD | Freq: Once | CUTANEOUS | Status: DC

## 2016-01-17 MED ORDER — LIDOCAINE HCL (CARDIAC) 20 MG/ML IV SOLN
INTRAVENOUS | Status: DC | PRN
Start: 2016-01-17 — End: 2016-01-17
  Administered 2016-01-17: 100 mg via INTRAVENOUS

## 2016-01-17 MED ORDER — 0.9 % SODIUM CHLORIDE (POUR BTL) OPTIME
TOPICAL | Status: DC | PRN
  Administered 2016-01-17: 1000 mL

## 2016-01-17 MED ORDER — MIDAZOLAM HCL 2 MG/2ML IJ SOLN
INTRAMUSCULAR | Status: AC
  Filled 2016-01-17: qty 2

## 2016-01-17 MED ORDER — EPHEDRINE 5 MG/ML INJ
INTRAVENOUS | Status: AC
  Filled 2016-01-17: qty 10

## 2016-01-17 MED ORDER — ONDANSETRON HCL 4 MG/2ML IJ SOLN
4.0000 mg | INTRAMUSCULAR | Status: DC | PRN
  Filled 2016-01-17: qty 2

## 2016-01-17 MED ORDER — ONDANSETRON HCL 4 MG/2ML IJ SOLN
INTRAMUSCULAR | Status: AC
  Filled 2016-01-17: qty 2

## 2016-01-17 MED ORDER — LACTATED RINGERS IV SOLN
INTRAVENOUS | Status: DC | PRN
  Administered 2016-01-17: 07:00:00 via INTRAVENOUS

## 2016-01-17 MED ORDER — PHENYLEPHRINE 40 MCG/ML (10ML) SYRINGE FOR IV PUSH (FOR BLOOD PRESSURE SUPPORT)
PREFILLED_SYRINGE | INTRAVENOUS | Status: AC
  Filled 2016-01-17: qty 10

## 2016-01-17 MED ORDER — MORPHINE SULFATE (PF) 2 MG/ML IV SOLN: 2.0000 mg | INTRAVENOUS | Status: DC | PRN

## 2016-01-17 MED ORDER — FENTANYL CITRATE (PF) 100 MCG/2ML IJ SOLN
INTRAMUSCULAR | Status: AC
  Filled 2016-01-17: qty 2

## 2016-01-17 MED ORDER — OXYCODONE-ACETAMINOPHEN 5-325 MG PO TABS: 1.0000 | ORAL_TABLET | ORAL | Status: DC | PRN

## 2016-01-17 MED ORDER — SUCCINYLCHOLINE CHLORIDE 200 MG/10ML IV SOSY
PREFILLED_SYRINGE | INTRAVENOUS | Status: AC
  Filled 2016-01-17: qty 10

## 2016-01-17 MED ORDER — OXYCODONE-ACETAMINOPHEN 5-325 MG PO TABS
ORAL_TABLET | ORAL | Status: AC
  Administered 2016-01-17: 1
  Filled 2016-01-17: qty 1

## 2016-01-17 MED ORDER — LIDOCAINE 2% (20 MG/ML) 5 ML SYRINGE
INTRAMUSCULAR | Status: AC
  Filled 2016-01-17: qty 5

## 2016-01-17 MED ORDER — FENTANYL CITRATE (PF) 100 MCG/2ML IJ SOLN
INTRAMUSCULAR | Status: DC | PRN
  Administered 2016-01-17 (×2): 25 ug via INTRAVENOUS
  Administered 2016-01-17: 50 ug via INTRAVENOUS
  Administered 2016-01-17 (×2): 25 ug via INTRAVENOUS

## 2016-01-17 MED ORDER — PROPOFOL 10 MG/ML IV BOLUS
INTRAVENOUS | Status: DC | PRN
  Administered 2016-01-17: 150 mg via INTRAVENOUS

## 2016-01-17 MED ORDER — BUPIVACAINE-EPINEPHRINE (PF) 0.25% -1:200000 IJ SOLN
INTRAMUSCULAR | Status: AC
  Filled 2016-01-17: qty 30

## 2016-01-17 MED ORDER — MEPERIDINE HCL 25 MG/ML IJ SOLN: 6.2500 mg | INTRAMUSCULAR | Status: DC | PRN

## 2016-01-17 MED ORDER — HYDROMORPHONE HCL 1 MG/ML IJ SOLN
0.2500 mg | INTRAMUSCULAR | Status: DC | PRN
  Administered 2016-01-17 (×2): 0.5 mg via INTRAVENOUS

## 2016-01-17 MED ORDER — ONDANSETRON HCL 4 MG/2ML IJ SOLN
4.0000 mg | Freq: Once | INTRAMUSCULAR | Status: DC | PRN
Start: 2016-01-17 — End: 2016-01-17

## 2016-01-17 SURGICAL SUPPLY — 68 items
APPLIER CLIP 9.375 MED OPEN (MISCELLANEOUS) ×3
BENZOIN TINCTURE PRP APPL 2/3 (GAUZE/BANDAGES/DRESSINGS) ×3 IMPLANT
BIOPATCH RED 1 DISK 7.0 (GAUZE/BANDAGES/DRESSINGS) ×2 IMPLANT
BIOPATCH RED 1IN DISK 7.0MM (GAUZE/BANDAGES/DRESSINGS) ×1
BLADE SURG ROTATE 9660 (MISCELLANEOUS) ×3 IMPLANT
BNDG COHESIVE 4X5 TAN STRL (GAUZE/BANDAGES/DRESSINGS) ×3 IMPLANT
CHLORAPREP W/TINT 26ML (MISCELLANEOUS) ×3 IMPLANT
CLEANER TIP ELECTROSURG 2X2 (MISCELLANEOUS) IMPLANT
CLIP APPLIE 9.375 MED OPEN (MISCELLANEOUS) ×1 IMPLANT
CLOSURE WOUND 1/2 X4 (GAUZE/BANDAGES/DRESSINGS) ×1
CONT SPEC 4OZ CLIKSEAL STRL BL (MISCELLANEOUS) ×3 IMPLANT
COVER SURGICAL LIGHT HANDLE (MISCELLANEOUS) ×3 IMPLANT
DECANTER SPIKE VIAL GLASS SM (MISCELLANEOUS) ×3 IMPLANT
DRAIN CHANNEL 19F RND (DRAIN) ×3 IMPLANT
DRAPE CHEST BREAST 15X10 FENES (DRAPES) ×3 IMPLANT
DRAPE LAPAROTOMY T 98X78 PEDS (DRAPES) ×3 IMPLANT
DRAPE UTILITY XL STRL (DRAPES) ×3 IMPLANT
DRSG OPSITE 4X5.5 SM (GAUZE/BANDAGES/DRESSINGS) IMPLANT
DRSG TEGADERM 2-3/8X2-3/4 SM (GAUZE/BANDAGES/DRESSINGS) ×3 IMPLANT
DRSG TEGADERM 4X4.75 (GAUZE/BANDAGES/DRESSINGS) ×3 IMPLANT
ELECT BLADE 6.5 EXT (BLADE) ×3 IMPLANT
ELECT REM PT RETURN 9FT ADLT (ELECTROSURGICAL) ×3
ELECTRODE REM PT RTRN 9FT ADLT (ELECTROSURGICAL) ×1 IMPLANT
EVACUATOR SILICONE 100CC (DRAIN) ×3 IMPLANT
GAUZE SPONGE 4X4 12PLY STRL (GAUZE/BANDAGES/DRESSINGS) ×3 IMPLANT
GAUZE SPONGE 4X4 16PLY XRAY LF (GAUZE/BANDAGES/DRESSINGS) ×3 IMPLANT
GLOVE BIO SURGEON STRL SZ7 (GLOVE) ×3 IMPLANT
GLOVE BIO SURGEON STRL SZ8 (GLOVE) ×6 IMPLANT
GLOVE BIOGEL PI IND STRL 6.5 (GLOVE) ×3 IMPLANT
GLOVE BIOGEL PI IND STRL 7.5 (GLOVE) ×1 IMPLANT
GLOVE BIOGEL PI IND STRL 8 (GLOVE) ×2 IMPLANT
GLOVE BIOGEL PI IND STRL 8.5 (GLOVE) ×1 IMPLANT
GLOVE BIOGEL PI INDICATOR 6.5 (GLOVE) ×6
GLOVE BIOGEL PI INDICATOR 7.5 (GLOVE) ×2
GLOVE BIOGEL PI INDICATOR 8 (GLOVE) ×4
GLOVE BIOGEL PI INDICATOR 8.5 (GLOVE) ×2
GLOVE ECLIPSE 6.0 STRL STRAW (GLOVE) ×6 IMPLANT
GOWN STRL REUS W/ TWL LRG LVL3 (GOWN DISPOSABLE) ×3 IMPLANT
GOWN STRL REUS W/ TWL XL LVL3 (GOWN DISPOSABLE) ×2 IMPLANT
GOWN STRL REUS W/TWL LRG LVL3 (GOWN DISPOSABLE) ×6
GOWN STRL REUS W/TWL XL LVL3 (GOWN DISPOSABLE) ×4
HEMOSTAT ARISTA ABSORB 3G PWDR (MISCELLANEOUS) ×3 IMPLANT
ILLUMINATOR WAVEGUIDE N/F (MISCELLANEOUS) ×3 IMPLANT
KIT BASIN OR (CUSTOM PROCEDURE TRAY) ×3 IMPLANT
KIT ROOM TURNOVER OR (KITS) ×3 IMPLANT
NEEDLE HYPO 25GX1X1/2 BEV (NEEDLE) ×3 IMPLANT
NS IRRIG 1000ML POUR BTL (IV SOLUTION) ×3 IMPLANT
PACK SURGICAL SETUP 50X90 (CUSTOM PROCEDURE TRAY) ×3 IMPLANT
PAD ARMBOARD 7.5X6 YLW CONV (MISCELLANEOUS) ×3 IMPLANT
PENCIL BUTTON HOLSTER BLD 10FT (ELECTRODE) ×3 IMPLANT
SPECIMEN JAR MEDIUM (MISCELLANEOUS) ×3 IMPLANT
SPONGE GAUZE 4X4 12PLY STER LF (GAUZE/BANDAGES/DRESSINGS) ×3 IMPLANT
SPONGE LAP 4X18 X RAY DECT (DISPOSABLE) ×6 IMPLANT
STOCKINETTE IMPERVIOUS LG (DRAPES) ×3 IMPLANT
STRIP CLOSURE SKIN 1/2X4 (GAUZE/BANDAGES/DRESSINGS) ×2 IMPLANT
SUT ETHILON 2 0 FS 18 (SUTURE) ×3 IMPLANT
SUT MNCRL AB 4-0 PS2 18 (SUTURE) ×3 IMPLANT
SUT SILK 2 0 SH (SUTURE) ×3 IMPLANT
SUT VIC AB 3-0 SH 27 (SUTURE) ×2
SUT VIC AB 3-0 SH 27XBRD (SUTURE) ×1 IMPLANT
SYR BULB 3OZ (MISCELLANEOUS) ×3 IMPLANT
SYR CONTROL 10ML LL (SYRINGE) ×3 IMPLANT
TOWEL OR 17X24 6PK STRL BLUE (TOWEL DISPOSABLE) ×3 IMPLANT
TOWEL OR 17X26 10 PK STRL BLUE (TOWEL DISPOSABLE) IMPLANT
TUBE CONNECTING 12'X1/4 (SUCTIONS) ×1
TUBE CONNECTING 12X1/4 (SUCTIONS) ×2 IMPLANT
WATER STERILE IRR 1000ML POUR (IV SOLUTION) IMPLANT
YANKAUER SUCT BULB TIP NO VENT (SUCTIONS) ×3 IMPLANT

## 2016-01-17 NOTE — Op Note (Signed)
Preop diagnosis: Metastatic melanoma to the right axilla Postop diagnosis: Same Procedure performed: Right axillary lymph node dissection Surgeon:Wei Newbrough K. Asst.: Dr. Georganna Skeans Anesthesia: Gen. via LMA Indications:  This is a 55 year old male who presented initially March 2017 with a rapid enlarging right chest wall mass in his right axilla. CT scan showed that this was a massive necrotic lymph node with multiple other enlarged lymph nodes. Biopsy showed that this was metastatic melanoma with unknown primary. He has undergone treatment with Keytruda with significant response. Recent CT PET scan showed much improved size of lymphadenopathy but still with some activity on PET/CT. He presents now for axillary lymph node dissection.  Description of procedure: The patient is brought to the operating room and placed in the supine position on the operating table. A small towel was placed behind his right shoulder.  After an adequate level general anesthesia was obtained, his right chest axilla and shoulder were prepped with ChloraPrep and draped sterile fashion. We had prepped all the way down to his elbow and isolated his forearm in an impervious stockinette. A timeout was taken to ensure the proper patient and proper procedure. We drew a curvilinear incision across the base of this axilla. We have tried this area with 0.25% Marcaine with epinephrine. We made our incision. Dissection was carried down to the 60s tissue to the axillary fascia. We open the axilla and answered the axilla contents. We began dissecting medially. I identified the edge of the pectoralis. We bluntly dissected down all lung chest wall to with identified the serratus. We were able to visualize the long thoracic nerve. This was preserved. We dissected up underneath the edge of the pectoralis and dissected the axilla contents laterally. We continued superiorly until we could visualize the axillary vein. A couple small branches were  ligated with clips. We then dissected posteriorly. We identified the thoracodorsal bundle with some stimulation of the nerve. We then dissected laterally and inferiorly to free up the axilla contents from the surrounding tissue. We excised the entire specimen and one piece. There are a lot of hard fibrotic lymph nodes that are grossly black in color. There are some necrotic lymph node tissue that is palpated. We excised the entire specimen and oriented with a single suture at the apex and a double suture at the base of the axilla. We irrigated the wound thoroughly. No further gross disease was palpated. We could identify our long thoracic nerve and the thoracodorsal bundle as well as axillary vein. We irrigated the wound thoroughly and inspected for hemostasis. We placed a 27 French drain through a small stab incision inferiorly and cut this to fit the wound. We covered the insulin wound with Arista however. The drain was sutured in place with a 2-0 nylon suture. The wound was then closed with 3-0 Vicryl. 4-0 Monocryl sutures close skin. Sterile dressing was applied. The patient was then extubated and brought to recovery was stable condition. All sponge, instrument, and needle counts are correct.  Imogene Burn. Georgette Dover, MD, [facility] Surgery  General/ Trauma Surgery  01/17/2016 9:52 AM

## 2016-01-17 NOTE — H&P (View-Only) (Signed)
Patient words: f/u.  The patient is a 55 year old male who presents for a melanoma biopsy follow-up. This is a 55 year old male in good health who is fairly active who initially presented in March 2017 with about a 3 month history of a fairly rapidly enlarging right chest wall mass. His wife first palpated it about 3 months before and it was only a couple centimeters across. It grew to over 10 cm across. Workup revealed a diagnosis of malignant melanoma with unknown primary. He has undergone treatment with Keytruda under the care of Dr. Marin Olp and has shown a dramatic improvement. He no longer has any right arm swelling which she had at initial presentation. He has full use of his arm and has resumed full activity. He underwent a recent CT scan that showed significant decrease in the size of the enlarged lymph nodes. He presents now to discuss axillary lymph node dissection. I had a discussion with Dr. Marin Olp on 11/15/15 and our plan was to try to get him into the office to discuss surgery in late July with surgery to be done in early August. However the patient went to the beach for the entire month of July. He did have a CT scan but he presents now for his preoperative visit.  CLINICAL DATA: Restaging melanoma, oral chemotherapy ongoing. Right axillary mass. EXAM: CT CHEST WITH CONTRAST TECHNIQUE: Multidetector CT imaging of the chest was performed during intravenous contrast administration. CONTRAST: 75 cc Omnipaque 300. COMPARISON: PET 12/12/2015 and 08/15/2015. CT chest 07/22/2015. FINDINGS: Cardiovascular: Vascular structures are unremarkable. Heart size normal. No pericardial effusion. Mediastinum/Nodes: No pathologically enlarged mediastinal, hilar or left axillary lymph nodes. Right axillary adenopathy measures up to 1.4 x 3.4 cm, previously 4.2 x 6.5 cm. High right subpectoral lymph node measures 6 mm, previously 2.6 cm. Esophagus is  grossly unremarkable. Lungs/Pleura: Minimal biapical pleural parenchymal scarring. Lungs are otherwise clear. No pleural fluid. Airway is unremarkable. Upper Abdomen: Low-attenuation lesions in the liver measure up to 1.3 cm, stable. Visualized portions of the liver, gallbladder, adrenal glands, kidneys, spleen, pancreas, stomach and bowel are otherwise grossly unremarkable. Gastrohepatic ligament lymph node measures 10 mm, stable. Musculoskeletal: No worrisome lytic or sclerotic lesions. Degenerative changes are seen in the spine. IMPRESSION: Marked decrease in size of right subpectoral and right axillary adenopathy without complete resolution. Electronically Signed: By: Lorin Picket M.D. On: 12/12/2015 14:06  CLINICAL DATA: Subsequent treatment strategy for melanoma. EXAM: NUCLEAR MEDICINE PET WHOLE BODY TECHNIQUE: 11.8 mCi F-18 FDG was injected intravenously. Full-ring PET imaging was performed from the vertex to the feet after the radiotracer. CT data was obtained and used for attenuation correction and anatomic localization. FASTING BLOOD GLUCOSE: Value: 105 mg/dl COMPARISON: CT chest 12/12/2015, PET 08/15/2015 and CT chest 07/22/2015. FINDINGS: Head/Neck: No hypermetabolic lymph nodes in the neck. CT images show no acute findings. Chest: Hypermetabolic right subpectoral and right axillary lymph nodes have decreased in size. Largest lymph node measures 1.3 x 2.7 cm in the right axilla with an SUV max of 8.3, compared to 5.1 x 7.1 cm and SUV max of 29.3 on 08/15/2015. No hypermetabolic mediastinal, hilar or left axillary lymph nodes. No hypermetabolic pulmonary nodules. CT images show no pericardial or pleural effusion. Abdomen/Pelvis: No abnormal hypermetabolism in the liver, adrenal glands, spleen or pancreas. No hypermetabolic lymph nodes. Low-attenuation lesions in the liver measure up to 10 mm in the left hepatic lobe, as before. Liver, gallbladder, adrenal  glands, kidneys, spleen, pancreas, stomach and bowel are otherwise grossly unremarkable.  Skeleton: No abnormal osseous hypermetabolism. Extremities: No hypermetabolic activity to suggest metastasis. IMPRESSION: Interval response to therapy as evidenced by decrease in size and hypermetabolism involving right subpectoral/ axillary lymph nodes, without complete resolution. Electronically Signed By: Lorin Picket M.D. On: 12/12/2015 14:06   Problem List/Past Medical Maia Petties, MD; 12/26/2015 1:13 PM) MALIGNANT MELANOMA OF AXILLA (C43.59) MASS OF RIGHT CHEST WALL (R22.2)  Other Problems Maia Petties, MD; 12/26/2015 1:13 PM) Hemorrhoids Gastroesophageal Reflux Disease  Past Surgical History Maia Petties, MD; 12/26/2015 1:13 PM) Tonsillectomy  Diagnostic Studies History Maia Petties, MD; 12/26/2015 1:13 PM) Colonoscopy 1-5 years ago  Allergies Malachi Bonds, CMA; 12/26/2015 8:26 AM) No Known Drug Allergies 07/22/2015  Medication History (Chemira Jones, CMA; 12/26/2015 8:26 AM) Ketoconazole (2% Cream, External) Active. Triamcinolone Acetonide (0.5% Cream, External) Active. Fluconazole (100MG  Tablet, Oral) Active. Medications Reconciled    Vitals (Chemira Jones CMA; 12/26/2015 8:26 AM) 12/26/2015 8:25 AM Weight: 234 lb Height: 73in Body Surface Area: 2.3 m Body Mass Index: 30.87 kg/m  Temp.: 97.32F(Oral)  Pulse: 56 (Regular)  BP: 110/78 (Sitting, Left Arm, Standard)      Physical Exam Rodman Key K. Evonte Prestage MD; 12/26/2015 1:14 PM)  The physical exam findings are as follows: Note:WDWN in NAD HEENT: EOMI, sclera anicteric Neck: No masses, no thyromegaly Lungs: CTA bilaterally; normal respiratory effort Right axilla - slight fullness but no palpable dominant lymphadenopathy CV: Regular rate and rhythm; no murmurs Abd: +bowel sounds, soft, non-tender, no masses Ext: Well-perfused; no arm swelling noted today Skin: Warm, dry; no sign of  jaundice    Assessment & Plan Rodman Key K. Levander Katzenstein MD; 12/26/2015 1:15 PM)  MALIGNANT MELANOMA OF AXILLA (C43.59)  Current Plans Schedule for Surgery - Right axillary lymph node dissection. The surgical procedure has been discussed with the patient. Potential risks, benefits, alternative treatments, and expected outcomes have been explained. All of the patient's questions at this time have been answered. The likelihood of reaching the patient's treatment goal is good. The patient understand the proposed surgical procedure and wishes to proceed.  We discussed the possible side effects of axillary lymph node dissection including nerve damage and lymphedema. The patient had arm swelling at his initial presentation in March of this year but this seems to have resolved with the decrease in the size of the lymphadenopathy. He understands the risks of surgery but also understands the need for a lymph node dissection with possible adjuvant radiation.  Imogene Burn. Georgette Dover, MD, [facility] Surgery  General/ Trauma Surgery  12/26/2015 1:15 PM

## 2016-01-17 NOTE — Transfer of Care (Signed)
Immediate Anesthesia Transfer of Care Note  Patient: Jonathan Hayes  Procedure(s) Performed: Procedure(s): RIGHT AXILLARY LYMPH NODE DISSECTION (Right)  Patient Location: PACU  Anesthesia Type:General  Level of Consciousness: awake, alert , oriented and patient cooperative  Airway & Oxygen Therapy: Patient Spontanous Breathing and Patient connected to nasal cannula oxygen  Post-op Assessment: Report given to RN, Post -op Vital signs reviewed and stable and Patient moving all extremities  Post vital signs: Reviewed and stable  Last Vitals:  Vitals:   01/17/16 0605  BP: 124/72  Pulse: (!) 58  Resp: 20  Temp: 36.7 C    Last Pain: There were no vitals filed for this visit.    Patients Stated Pain Goal: 3 ([id number])  Complications: No apparent anesthesia complications

## 2016-01-17 NOTE — Anesthesia Preprocedure Evaluation (Addendum)
Anesthesia Evaluation  Patient identified by MRN, date of birth, ID band Patient awake    Reviewed: Allergy & Precautions, NPO status , Patient's Chart, lab work & pertinent test results  History of Anesthesia Complications Negative for: history of anesthetic complications  Airway Mallampati: I  TM Distance: >3 FB Neck ROM: Full    Dental  (+) Teeth Intact, Dental Advisory Given   Pulmonary    Pulmonary exam normal breath sounds clear to auscultation       Cardiovascular Normal cardiovascular exam Rhythm:Regular Rate:Normal     Neuro/Psych    GI/Hepatic   Endo/Other    Renal/GU      Musculoskeletal   Abdominal   Peds  Hematology   Anesthesia Other Findings   Reproductive/Obstetrics                            Anesthesia Physical Anesthesia Plan  ASA: II  Anesthesia Plan: General   Post-op Pain Management:    Induction: Intravenous  Airway Management Planned: LMA  Additional Equipment:   Intra-op Plan:   Post-operative Plan: Extubation in OR  Informed Consent: I have reviewed the patients History and Physical, chart, labs and discussed the procedure including the risks, benefits and alternatives for the proposed anesthesia with the patient or authorized representative who has indicated his/her understanding and acceptance.   Dental advisory given  Plan Discussed with: Surgeon and CRNA  Anesthesia Plan Comments:        Anesthesia Quick Evaluation

## 2016-01-17 NOTE — Anesthesia Postprocedure Evaluation (Signed)
Anesthesia Post Note  Patient: Jonathan Hayes  Procedure(s) Performed: Procedure(s) (LRB): RIGHT AXILLARY LYMPH NODE DISSECTION (Right)  Patient location during evaluation: PACU Anesthesia Type: General Level of consciousness: awake and alert Pain management: pain level controlled Vital Signs Assessment: post-procedure vital signs reviewed and stable Respiratory status: spontaneous breathing, nonlabored ventilation, respiratory function stable and patient connected to nasal cannula oxygen Cardiovascular status: blood pressure returned to baseline and stable Postop Assessment: no signs of nausea or vomiting Anesthetic complications: no    Last Vitals:  Vitals:   01/17/16 1015 01/17/16 1017  BP: 128/74 138/84  Pulse: (!) 50 (!) 53  Resp: 16   Temp: 36.6 C     Last Pain:  Vitals:   01/17/16 1017  PainSc: 3                  [name] DAVID

## 2016-01-17 NOTE — Anesthesia Procedure Notes (Signed)
Procedure Name: LMA Insertion Date/Time: 01/17/2016 7:38 AM Performed by: Myna Bright Pre-anesthesia Checklist: Patient identified, Emergency Drugs available, Suction available and Patient being monitored Patient Re-evaluated:Patient Re-evaluated prior to inductionOxygen Delivery Method: Circle system utilized Preoxygenation: Pre-oxygenation with 100% oxygen Intubation Type: IV induction LMA: LMA inserted LMA Size: 5.0 Tube type: Oral Number of attempts: 1 Placement Confirmation: positive ETCO2 and breath sounds checked- equal and bilateral Tube secured with: Tape Dental Injury: Teeth and Oropharynx as per pre-operative assessment

## 2016-01-17 NOTE — Discharge Instructions (Signed)
[facility] [place] [phone]  POST OP INSTRUCTIONS  Always review your discharge instruction sheet given to you by the facility where your surgery was performed. IF YOU HAVE DISABILITY OR FAMILY LEAVE FORMS, YOU MUST BRING THEM TO THE OFFICE FOR PROCESSING.   DO NOT GIVE THEM TO YOUR DOCTOR. A prescription for pain medication may be given to you upon discharge.  Take your pain medication as prescribed, if needed.  If narcotic pain medicine is not needed, then you may take acetaminophen (Tylenol) or ibuprofen (Advil) as needed. 1. Take your usually prescribed medications unless otherwise directed. 2. If you need a refill on your pain medication, please contact your pharmacy.  They will contact our office to request authorization.  Prescriptions will not be filled after 5pm or on week-ends. 3. You should follow a light diet the first few days after arrival home, such as soup and crackers, etc.  Resume your normal diet the day after surgery. 4. Most patients will experience some swelling and bruising on the chest and underarm.  Ice packs will help.  Swelling and bruising can take several days to resolve.  5. It is common to experience some constipation if taking pain medication after surgery.  Increasing fluid intake and taking a stool softener (such as Colace) will usually help or prevent this problem from occurring.  A mild laxative (Milk of Magnesia or Miralax) should be taken according to package instructions if there are no bowel movements after 48 hours. 6. Remove the outer dressing in 48 hours.  You may tape a small piece of gauze around the drain site.  If you shower, seal the drain site with plastic wrap and tape. 7. DRAINS:  If you have drains in place, it is important to keep a list of the amount of drainage produced each day in your drains.  Before leaving the hospital, you should be instructed on drain care.  Call our office if you have any questions about your  drains. 8. ACTIVITIES:  You may resume regular (light) daily activities beginning the next day--such as daily self-care, walking, climbing stairs--gradually increasing activities as tolerated.  You may have sexual intercourse when it is comfortable.  Refrain from any heavy lifting or straining until approved by your doctor. a. You may drive when you are no longer taking prescription pain medication, you can comfortably wear a seatbelt, and you can safely maneuver your car and apply brakes. b. RETURN TO WORK:  __________________________________________________________ 9. You should see your doctor in the office for a follow-up appointment approximately 3-5 days after your surgery.  Your doctors nurse will typically make your follow-up appointment when she calls you with your pathology report.  Expect your pathology report 2-3 business days after your surgery.  You may call to check if you do not hear from [place] after three days.   10. OTHER INSTRUCTIONS: ______________________________________________________________________________________________ ____________________________________________________________________________________________ WHEN TO CALL YOUR DOCTOR: 1. Fever over 101.0 2. Nausea and/or vomiting 3. Extreme swelling or bruising 4. Continued bleeding from incision. 5. Increased pain, redness, or drainage from the incision. The clinic staff is available to answer your questions during regular business hours.  Please dont hesitate to call and ask to speak to one of the nurses for clinical concerns.  If you have a medical emergency, go to the nearest emergency room or call 911.  A surgeon from [facility] is always on call at the hospital. [address] [place], [place], [place]  [address] ? [address] [place], [place]   [address] [phone] ? [phone] ? FAX [phone]

## 2016-01-17 NOTE — Interval H&P Note (Signed)
History and Physical Interval Note:  01/17/2016 7:16 AM  Jonathan Hayes  has presented today for surgery, with the diagnosis of Malignant melanoma  The various methods of treatment have been discussed with the patient and family. After consideration of risks, benefits and other options for treatment, the patient has consented to  Procedure(s): AXILLARY LYMPH NODE DISSECTION (Right) as a surgical intervention .  The patient's history has been reviewed, patient examined, no change in status, stable for surgery.  I have reviewed the patient's chart and labs.  Questions were answered to the patient's satisfaction.     Jonathan Hayes K.

## 2016-01-18 ENCOUNTER — Encounter (HOSPITAL_COMMUNITY): Payer: Self-pay | Admitting: Surgery

## 2016-02-01 ENCOUNTER — Telehealth: Payer: Self-pay | Admitting: *Deleted

## 2016-02-01 NOTE — Telephone Encounter (Signed)
Received call from patient stating he hasnt heard from Radiation Therapy yet.  Dr. Marin Olp talked to Dr. Sondra Come and he stated he needed to wait 5-6 weeks post surgery before starting radiation.

## 2016-02-07 NOTE — Progress Notes (Signed)
Histology and Location of Primary Cancer: malignant melanoma  Location(s) of Symptomatic tumor(s): enlarging right axillary/right upper anterior lateral chest mass that presented in December/January.  Pathology:  01/17/16 Diagnosis Lymph nodes, regional resection, Right Axillary Contents - METASTATIC MELANOMA IN EIGHTEEN OF TWENTY LYMPH NODES (18/20).  07/29/15 Lymph node, needle/core biopsy, right axillary Metastatic malignant melanoma  Past/Anticipated chemotherapy by medical oncology, if any: Status post 6 cycles of neoadjuvant Keytruda  Patient's main complaints related to symptomatic tumor(s) are: discomfort from swelling under his right arm.  Pain on a scale of 0-10 is: 1  SAFETY ISSUES:  Prior radiation? no  Pacemaker/ICD? no  Possible current pregnancy? no  Is the patient on methotrexate? no  Additional Complaints / other details:  Patient is here with his wife.  BP 126/75 (BP Location: Left Arm, Patient Position: Sitting)   Pulse (!) 58   Temp 97.9 F (36.6 C) (Oral)   Ht 6\' 1"  (1.854 m)   Wt 236 lb 9.6 oz (107.3 kg)   SpO2 100%   BMI 31.22 kg/m    Wt Readings from Last 3 Encounters:  02/09/16 236 lb 9.6 oz (107.3 kg)  01/17/16 232 lb (105.2 kg)  01/10/16 232 lb 9.6 oz (105.5 kg)

## 2016-02-09 ENCOUNTER — Ambulatory Visit: Payer: MEDICAID

## 2016-02-09 ENCOUNTER — Encounter: Payer: Self-pay | Admitting: Radiation Oncology

## 2016-02-09 ENCOUNTER — Ambulatory Visit: Payer: MEDICAID | Admitting: Radiation Oncology

## 2016-02-09 ENCOUNTER — Ambulatory Visit
Admission: RE | Admit: 2016-02-09 | Discharge: 2016-02-09 | Disposition: A | Discharge status: Home / Self Care | Payer: Self-pay | Source: Ambulatory Visit | Attending: Radiation Oncology | Admitting: Radiation Oncology

## 2016-02-09 DIAGNOSIS — C439 Malignant melanoma of skin, unspecified: Secondary | ICD-10-CM | POA: Insufficient documentation

## 2016-02-09 DIAGNOSIS — L80 Vitiligo: Secondary | ICD-10-CM | POA: Insufficient documentation

## 2016-02-09 DIAGNOSIS — Z510 Encounter for antineoplastic radiation therapy: Secondary | ICD-10-CM | POA: Insufficient documentation

## 2016-02-09 DIAGNOSIS — C4359 Malignant melanoma of other part of trunk: Secondary | ICD-10-CM

## 2016-02-09 DIAGNOSIS — C773 Secondary and unspecified malignant neoplasm of axilla and upper limb lymph nodes: Secondary | ICD-10-CM | POA: Insufficient documentation

## 2016-02-09 DIAGNOSIS — Z9221 Personal history of antineoplastic chemotherapy: Secondary | ICD-10-CM | POA: Insufficient documentation

## 2016-02-09 NOTE — Progress Notes (Signed)
Radiation Oncology         [phone] ________________________________  Name: Jonathan Hayes MRN: [id number]  Date: 02/09/2016  DOB: 05-01-1961  Follow-Up Visit Note  CC: Iverson Alamin, MD  Lemar Livings., MD    ICD-9-CM ICD-10-CM   1. Malignant melanoma of axilla ([place]) 172.5 C43.59     Diagnosis:     ICD-9-CM ICD-10-CM   1. 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 The patient presented to me on 08/08/15 to discuss possible radiation to his right axilla. The patient's lymph nodes sample from 07/29/15 was tested for the BRAF mutation and a mutation was detected. PET scan on 08/15/15 showed marked hypermetabolism in the right axilla with no other sites of disease.  The patient has completed 6 cycles of neoadjuvant Keytruda under the care of Dr. Marin Olp. CT of the chest w/ contrast on 12/12/15 showed a decrease in size of right subpectoral and right axillary adenopathy without complete resolution. PET scan the same day also showed interval response to therapy of the right subpectoral/axillary lymph nodes, but without complete resolution.  On 01/17/16, the patient underwent a right axillary lymph node dissection by Dr. Georgette Dover. 18/20 right axillary lymph nodes were positive for metastatic melanoma and many of the lymph nodes had extracapsular extension.  The patient presents today with his wife to discuss the role of adjuvant radiation for the management of his disease.  On review of systems: The patient reports a pain of 1/10 in the right axilla with discomfort from swelling underneath his right arm. He also complains of vitiligo from his [place]..  ALLERGIES:  is allergic to no known allergies.  Meds: Current Outpatient Prescriptions  Medication Sig Dispense Refill  . ibuprofen (ADVIL,MOTRIN) 200 MG tablet Take 200 mg by  mouth every 6 (six) hours as needed.    . Multiple Vitamin (MULTIVITAMIN) tablet Take 1 tablet by mouth daily.    Marland Kitchen acetaminophen (TYLENOL) 325 MG tablet Take 650 mg by mouth every 6 (six) hours as needed for mild pain.    Marland Kitchen oxyCODONE-acetaminophen (PERCOCET/ROXICET) 5-325 MG tablet Take 1 tablet by mouth every 4 (four) hours as needed for severe pain. (Patient not taking: Reported on 02/09/2016) 40 tablet 0  . prochlorperazine (COMPAZINE) 10 MG tablet Take 1 tablet (10 mg total) by mouth every 6 (six) hours as needed for nausea or vomiting. (Patient not taking: Reported on 02/09/2016) 30 tablet 1   No current facility-administered medications for this encounter.     Physical Findings: The patient is in no acute distress. Patient is alert and oriented.  height is '6\' 1"'$  (1.854 m) and weight is 236 lb 9.6 oz (107.3 kg). His oral temperature is 97.9 F (36.6 C). His blood pressure is 126/75 and his pulse is 58 (abnormal). His oxygen saturation is 100%.   Right axilla shows scar from his previous axillary dissection. Swelling in the area consistent with fluid accumulation. No signs of infection or drainage. The patient appears to have vitiligo along his arms and trunk area.  Lab Findings: Lab Results  Component Value Date   WBC 5.2 01/10/2016   HGB 14.1 01/10/2016   HCT 41.2 01/10/2016   MCV 86.0 01/10/2016   PLT 155 01/10/2016    Radiographic Findings: No results found.  Impression:  Stage III (TxN3M) metastatic melanoma of the right  axilla with an unknown primary site  The patient is a good candidate for adjuvant radiation to the right axilla and right supraclavicular region.  Today, I spoke to the patient and his wife about the findings and work-up thus far.  We discussed the natural history of metastatic melanoma and general treatment, highlighting the role of radiotherapy in the management.  We discussed the available radiation techniques and focused on the details of logistics and  delivery.  We reviewed the anticipated acute and late sequelae associated with radiation in this setting.  The patient was encouraged to ask questions that I answered to the best of my ability.  Plan: The patient signed a consent form and a copy was placed in his medical chart. CT simulation is scheduled on 02/22/16 at [facility] approximately 51/2 weeks of radiation therapy but may consider a hypo-fractionated accelerated course of radiation therapy given the diagnosis of melanoma. ____________________________________ -----------------------------------  Blair Promise, PhD, MD  This document serves as a record of services personally performed by Gery Pray, MD. It was created on his behalf by Darcus Austin, a trained medical scribe. The creation of this record is based on the scribe's personal observations and the provider's statements to them. This document has been checked and approved by the attending provider.

## 2016-02-09 NOTE — Progress Notes (Signed)
Please see the Nurse Progress Note in the MD Initial Consult Encounter for this patient. 

## 2016-02-14 ENCOUNTER — Telehealth: Payer: Self-pay | Admitting: *Deleted

## 2016-02-14 NOTE — Telephone Encounter (Signed)
Patient wants to cancel tomorrow's appointment as he states it's "a left over appointment from a previous treatment". Spoke to Dr Marin Olp and he agrees with cancelling the appointment tomorrow. Patient will call back to reschedule when his radiation treatments are complete.

## 2016-02-15 ENCOUNTER — Ambulatory Visit: Payer: Self-pay | Admitting: Hematology & Oncology

## 2016-02-15 ENCOUNTER — Other Ambulatory Visit: Payer: Self-pay

## 2016-02-20 ENCOUNTER — Telehealth: Payer: Self-pay | Admitting: *Deleted

## 2016-02-20 NOTE — Telephone Encounter (Signed)
Patient's wife calling the office.  Patient had an attempted axillary aspiration last week in Dr Vonna Kotyk office. Since that time his arm has been bothering him. Over the weekend, it became hot, swollen and developed red streaking towards his back. He is also now experiencing fevers and chills.  Spoke to Dr Marin Olp and he wants patient to call Dr Vonna Kotyk office since the probable infection source seems to be the aspiration from last week. Patient's wife will call the office to be seen. If they cannot be worked in patient's wife will bring him to the ED.

## 2016-02-22 ENCOUNTER — Encounter (HOSPITAL_BASED_OUTPATIENT_CLINIC_OR_DEPARTMENT_OTHER): Payer: Self-pay | Admitting: *Deleted

## 2016-02-22 ENCOUNTER — Ambulatory Visit: Payer: Self-pay | Admitting: Radiation Oncology

## 2016-02-22 ENCOUNTER — Ambulatory Visit: Payer: Self-pay | Admitting: Surgery

## 2016-02-22 ENCOUNTER — Ambulatory Visit: Payer: MEDICAID

## 2016-02-22 NOTE — H&P (Signed)
History of Present Illness Imogene Burn. Aron Inge MD; 02/22/2016 5:04 PM) The patient is a 55 year old male presenting for a post-operative visit. The patient is a 55 year old male presenting for a post-operative visit. The patient is status post right axillary lymph node dissection on 01/17/16. His pathology showed 18 out of 20 lymph nodes positive for metastatic melanoma. The patient has been doing quite well. He reports no pain. His drain continues to put out about 40 cc per day. The drainage is serosanguineous. He has a little bit of drainage from the drain site. Appetite and bowel movements are normal. He comes in today for his third postoperative visit.  He was seen last week by Dr. Sondra Come who has scheduled his radiation simulation for 02/22/16 and will begin treatment 02/29/16. He has an appointment with Dr. Marin Olp this week.  He has resumed full activity and has no weakness or problems with his shoulder or axilla. Last week, he developed significant swelling and erythema in the right axilla. He was seen by Dr. Hulen Skains on 10/2 and he aspirated approximately 240 cc of turbid appearing fluid. He was started on Keflex empirically, as the skin around the incision looked red. He has reaccumulated even more fluid and swelling, and the erythema has spread.   Allergies Elbert Ewings, CMA; 02/22/2016 3:15 PM) No Known Drug Allergies 07/22/2015  Medication History Elbert Ewings, CMA; 02/22/2016 3:15 PM) Keflex (250MG  Capsule, 1 (one) Capsule Oral four times daily, Taken starting 02/20/2016) Active. Ketoconazole (2% Cream, External) Active. Triamcinolone Acetonide (0.5% Cream, External) Active. Fluconazole (100MG  Tablet, Oral) Active. Medications Reconciled    Vitals Elbert Ewings CMA; 02/22/2016 3:16 PM) 02/22/2016 3:15 PM Weight: 235.8 lb Height: 74in Body Surface Area: 2.33 m Body Mass Index: 30.27 kg/m  Temp.: 98.14F(Temporal)  Pulse: 100 (Regular)  BP: 134/82 (Sitting,  Left Arm, Standard)      Physical Exam Rodman Key K. Kaylor Maiers MD; 02/22/2016 5:06 PM)  The physical exam findings are as follows: Note:The right axillary incision remains intact. There is a large amount swelling underneath the incision. There is some deep erythema around the incision but this seems to be fairly well localized. This does extend towards his chest. It does not extend down his arm. There is no heat to this area.  We prepped the area with ChloraPrep and anesthetized with 1% lidocaine with epinephrine. I made a 2 cm incision in the middle of the scar. We dissected down into the subcutaneous tissue. This seems to be some old blood clot coming from this area but we did not evacuate a large amount of fluid. I did not feel that we could adequately explore the wound completely in the office. A dressing was applied. We will post him for surgery tomorrow.    Assessment & Plan Imogene Burn. Elajah Kunsman MD; 02/22/2016 3:35 PM)  MALIGNANT MELANOMA OF AXILLA (C43.59)  HEMATOMA OF AXILLA, RIGHT, SEQUELA (S40.021S)  Current Plans Schedule for Surgery - Incision and drainage of right axillary hematoma. The surgical procedure has been discussed with the patient. Potential risks, benefits, alternative treatments, and expected outcomes have been explained. All of the patient's questions at this time have been answered. The likelihood of reaching the patient's treatment goal is good. The patient understand the proposed surgical procedure and wishes to proceed.  Imogene Burn. Georgette Dover, MD, [facility]  General/ Trauma Surgery  02/22/2016 5:06 PM

## 2016-02-22 NOTE — Progress Notes (Signed)
Pt denies SOB, chest pain, and being under the care of a cardiologist. Pt denies having a stress test, echo and cardiac cath. Pt denies having a chest x ray and EKG within the last year. Pt denies having any recent labs. Pt made aware to stop taking Aspirin, vitamins, fish oil, and herbal medications. Do not take any NSAIDs ie: Ibuprofen, Advil, Naproxen, BC and Goody Powder or any medication containing Aspirin. Pt verbalized understanding of all pre-op instructions.

## 2016-02-23 ENCOUNTER — Ambulatory Visit (HOSPITAL_COMMUNITY): Payer: Self-pay | Admitting: Certified Registered Nurse Anesthetist

## 2016-02-23 ENCOUNTER — Ambulatory Visit (HOSPITAL_BASED_OUTPATIENT_CLINIC_OR_DEPARTMENT_OTHER)
Admission: RE | Admit: 2016-02-23 | Discharge: 2016-02-23 | Disposition: A | Discharge status: Home / Self Care | Payer: Self-pay | Source: Ambulatory Visit | Attending: Surgery | Admitting: Surgery

## 2016-02-23 ENCOUNTER — Encounter (HOSPITAL_COMMUNITY)
Admission: RE | Disposition: A | Discharge status: Home / Self Care | Payer: Self-pay | Source: Ambulatory Visit | Attending: Surgery

## 2016-02-23 ENCOUNTER — Encounter (HOSPITAL_COMMUNITY): Payer: Self-pay | Admitting: *Deleted

## 2016-02-23 DIAGNOSIS — Y838 Other surgical procedures as the cause of abnormal reaction of the patient, or of later complication, without mention of misadventure at the time of the procedure: Secondary | ICD-10-CM | POA: Insufficient documentation

## 2016-02-23 DIAGNOSIS — L7634 Postprocedural seroma of skin and subcutaneous tissue following other procedure: Secondary | ICD-10-CM | POA: Insufficient documentation

## 2016-02-23 HISTORY — DX: Contusion of unspecified upper arm, initial encounter: S40.029A

## 2016-02-23 HISTORY — PX: INCISION AND DRAINAGE ABSCESS: SHX5864

## 2016-02-23 LAB — BASIC METABOLIC PANEL
ANION GAP: 9 (ref 5–15)
BUN: 9 mg/dL (ref 6–20)
CALCIUM: 8.7 mg/dL — AB (ref 8.9–10.3)
CO2: 24 mmol/L (ref 22–32)
CREATININE: 0.96 mg/dL (ref 0.61–1.24)
Chloride: 101 mmol/L (ref 101–111)
GLUCOSE: 103 mg/dL — AB (ref 65–99)
Potassium: 4.2 mmol/L (ref 3.5–5.1)
Sodium: 134 mmol/L — ABNORMAL LOW (ref 135–145)

## 2016-02-23 LAB — CBC
HCT: 40.5 % (ref 39.0–52.0)
Hemoglobin: 14 g/dL (ref 13.0–17.0)
MCH: 29.7 pg (ref 26.0–34.0)
MCHC: 34.6 g/dL (ref 30.0–36.0)
MCV: 85.8 fL (ref 78.0–100.0)
PLATELETS: 226 10*3/uL (ref 150–400)
RBC: 4.72 MIL/uL (ref 4.22–5.81)
RDW: 12.4 % (ref 11.5–15.5)
WBC: 8.2 10*3/uL (ref 4.0–10.5)

## 2016-02-23 SURGERY — INCISION AND DRAINAGE, ABSCESS
Anesthesia: General | Site: Axilla | Laterality: Right

## 2016-02-23 MED ORDER — ONDANSETRON HCL 4 MG/2ML IJ SOLN
INTRAMUSCULAR | Status: AC
  Filled 2016-02-23: qty 2

## 2016-02-23 MED ORDER — BUPIVACAINE-EPINEPHRINE (PF) 0.25% -1:200000 IJ SOLN
INTRAMUSCULAR | Status: AC
  Filled 2016-02-23: qty 30

## 2016-02-23 MED ORDER — CEFAZOLIN SODIUM-DEXTROSE 2-4 GM/100ML-% IV SOLN
2.0000 g | INTRAVENOUS | Status: AC
  Administered 2016-02-23: 2 g via INTRAVENOUS
  Filled 2016-02-23: qty 100

## 2016-02-23 MED ORDER — OXYCODONE HCL 5 MG PO TABS: 5.0000 mg | ORAL_TABLET | Freq: Four times a day (QID) | ORAL | 0 refills | Status: DC | PRN

## 2016-02-23 MED ORDER — GLYCOPYRROLATE 0.2 MG/ML IV SOSY
PREFILLED_SYRINGE | INTRAVENOUS | Status: AC
  Filled 2016-02-23: qty 3

## 2016-02-23 MED ORDER — CHLORHEXIDINE GLUCONATE CLOTH 2 % EX PADS: 6.0000 | MEDICATED_PAD | Freq: Once | CUTANEOUS | Status: DC

## 2016-02-23 MED ORDER — 0.9 % SODIUM CHLORIDE (POUR BTL) OPTIME
TOPICAL | Status: DC | PRN
  Administered 2016-02-23: 1000 mL

## 2016-02-23 MED ORDER — LACTATED RINGERS IV SOLN
INTRAVENOUS | Status: DC
  Administered 2016-02-23: 08:00:00 via INTRAVENOUS

## 2016-02-23 MED ORDER — MIDAZOLAM HCL 2 MG/2ML IJ SOLN
INTRAMUSCULAR | Status: AC
  Filled 2016-02-23: qty 2

## 2016-02-23 MED ORDER — PROPOFOL 10 MG/ML IV BOLUS
INTRAVENOUS | Status: DC | PRN
  Administered 2016-02-23: 150 mg via INTRAVENOUS

## 2016-02-23 MED ORDER — BUPIVACAINE-EPINEPHRINE 0.25% -1:200000 IJ SOLN
INTRAMUSCULAR | Status: DC | PRN
  Administered 2016-02-23: 20 mL

## 2016-02-23 MED ORDER — LIDOCAINE HCL (CARDIAC) 20 MG/ML IV SOLN
INTRAVENOUS | Status: DC | PRN
  Administered 2016-02-23: 100 mg via INTRAVENOUS

## 2016-02-23 MED ORDER — PROPOFOL 10 MG/ML IV BOLUS
INTRAVENOUS | Status: AC
  Filled 2016-02-23: qty 20

## 2016-02-23 MED ORDER — ONDANSETRON HCL 4 MG/2ML IJ SOLN
INTRAMUSCULAR | Status: DC | PRN
  Administered 2016-02-23: 4 mg via INTRAVENOUS

## 2016-02-23 MED ORDER — MIDAZOLAM HCL 5 MG/5ML IJ SOLN
INTRAMUSCULAR | Status: DC | PRN
  Administered 2016-02-23: 2 mg via INTRAVENOUS

## 2016-02-23 MED ORDER — FENTANYL CITRATE (PF) 100 MCG/2ML IJ SOLN
INTRAMUSCULAR | Status: AC
  Filled 2016-02-23: qty 4

## 2016-02-23 MED ORDER — ONDANSETRON HCL 4 MG/2ML IJ SOLN: 4.0000 mg | Freq: Once | INTRAMUSCULAR | Status: DC | PRN

## 2016-02-23 MED ORDER — FENTANYL CITRATE (PF) 100 MCG/2ML IJ SOLN
INTRAMUSCULAR | Status: DC | PRN
  Administered 2016-02-23: 25 ug via INTRAVENOUS
  Administered 2016-02-23: 100 ug via INTRAVENOUS
  Administered 2016-02-23: 25 ug via INTRAVENOUS

## 2016-02-23 MED ORDER — MEPERIDINE HCL 25 MG/ML IJ SOLN: 6.2500 mg | INTRAMUSCULAR | Status: DC | PRN

## 2016-02-23 MED ORDER — HYDROMORPHONE HCL 1 MG/ML IJ SOLN: 0.2500 mg | INTRAMUSCULAR | Status: DC | PRN

## 2016-02-23 MED ORDER — LIDOCAINE 2% (20 MG/ML) 5 ML SYRINGE
INTRAMUSCULAR | Status: AC
  Filled 2016-02-23: qty 15

## 2016-02-23 SURGICAL SUPPLY — 34 items
BLADE SURG ROTATE 9660 (MISCELLANEOUS) IMPLANT
BNDG GAUZE ELAST 4 BULKY (GAUZE/BANDAGES/DRESSINGS) IMPLANT
CANISTER SUCTION 2500CC (MISCELLANEOUS) ×3 IMPLANT
COVER SURGICAL LIGHT HANDLE (MISCELLANEOUS) ×3 IMPLANT
DRAIN PENROSE 1X12 LTX STRL (DRAIN) ×3 IMPLANT
DRAPE LAPAROSCOPIC ABDOMINAL (DRAPES) IMPLANT
DRAPE LAPAROTOMY T 98X78 PEDS (DRAPES) IMPLANT
DRSG PAD ABDOMINAL 8X10 ST (GAUZE/BANDAGES/DRESSINGS) ×3 IMPLANT
ELECT CAUTERY BLADE 6.4 (BLADE) ×3 IMPLANT
ELECT REM PT RETURN 9FT ADLT (ELECTROSURGICAL) ×3
ELECTRODE REM PT RTRN 9FT ADLT (ELECTROSURGICAL) ×1 IMPLANT
GAUZE SPONGE 4X4 12PLY STRL (GAUZE/BANDAGES/DRESSINGS) IMPLANT
GLOVE BIO SURGEON STRL SZ7 (GLOVE) ×3 IMPLANT
GLOVE BIOGEL PI IND STRL 7.0 (GLOVE) ×1 IMPLANT
GLOVE BIOGEL PI IND STRL 7.5 (GLOVE) ×1 IMPLANT
GLOVE BIOGEL PI INDICATOR 7.0 (GLOVE) ×2
GLOVE BIOGEL PI INDICATOR 7.5 (GLOVE) ×2
GOWN STRL REUS W/ TWL LRG LVL3 (GOWN DISPOSABLE) ×2 IMPLANT
GOWN STRL REUS W/TWL LRG LVL3 (GOWN DISPOSABLE) ×4
KIT BASIN OR (CUSTOM PROCEDURE TRAY) ×3 IMPLANT
KIT ROOM TURNOVER OR (KITS) ×3 IMPLANT
NS IRRIG 1000ML POUR BTL (IV SOLUTION) ×3 IMPLANT
PACK SURGICAL SETUP 50X90 (CUSTOM PROCEDURE TRAY) ×3 IMPLANT
PAD ARMBOARD 7.5X6 YLW CONV (MISCELLANEOUS) ×3 IMPLANT
PENCIL BUTTON HOLSTER BLD 10FT (ELECTRODE) ×3 IMPLANT
SUT ETHILON 2 0 FS 18 (SUTURE) ×3 IMPLANT
SWAB COLLECTION DEVICE MRSA (MISCELLANEOUS) ×3 IMPLANT
SYR CONTROL 10ML LL (SYRINGE) ×3 IMPLANT
TOWEL OR 17X24 6PK STRL BLUE (TOWEL DISPOSABLE) ×3 IMPLANT
TOWEL OR 17X26 10 PK STRL BLUE (TOWEL DISPOSABLE) ×3 IMPLANT
TUBE ANAEROBIC SPECIMEN COL (MISCELLANEOUS) IMPLANT
TUBE CONNECTING 12'X1/4 (SUCTIONS) ×1
TUBE CONNECTING 12X1/4 (SUCTIONS) ×2 IMPLANT
YANKAUER SUCT BULB TIP NO VENT (SUCTIONS) ×3 IMPLANT

## 2016-02-23 NOTE — Discharge Instructions (Signed)
Keep dry gauze over the incision.  You will need to change this whenever the drainage begins to seep through the dressing.  You may take quick showers, but keep the dressing over the wound to prevent water from running into the wound.

## 2016-02-23 NOTE — H&P (View-Only) (Signed)
History of Present Illness Jonathan Hayes. Imunique Samad MD; 02/22/2016 5:04 PM) The patient is a 55 year old male presenting for a post-operative visit. The patient is a 55 year old male presenting for a post-operative visit. The patient is status post right axillary lymph node dissection on 01/17/16. His pathology showed 18 out of 20 lymph nodes positive for metastatic melanoma. The patient has been doing quite well. He reports no pain. His drain continues to put out about 40 cc per day. The drainage is serosanguineous. He has a little bit of drainage from the drain site. Appetite and bowel movements are normal. He comes in today for his third postoperative visit.  He was seen last week by Dr. Sondra Come who has scheduled his radiation simulation for 02/22/16 and will begin treatment 02/29/16. He has an appointment with Dr. Marin Olp this week.  He has resumed full activity and has no weakness or problems with his shoulder or axilla. Last week, he developed significant swelling and erythema in the right axilla. He was seen by Dr. Hulen Skains on 10/2 and he aspirated approximately 240 cc of turbid appearing fluid. He was started on Keflex empirically, as the skin around the incision looked red. He has reaccumulated even more fluid and swelling, and the erythema has spread.   Allergies Elbert Ewings, CMA; 02/22/2016 3:15 PM) No Known Drug Allergies 07/22/2015  Medication History Elbert Ewings, CMA; 02/22/2016 3:15 PM) Keflex (250MG  Capsule, 1 (one) Capsule Oral four times daily, Taken starting 02/20/2016) Active. Ketoconazole (2% Cream, External) Active. Triamcinolone Acetonide (0.5% Cream, External) Active. Fluconazole (100MG  Tablet, Oral) Active. Medications Reconciled    Vitals Elbert Ewings CMA; 02/22/2016 3:16 PM) 02/22/2016 3:15 PM Weight: 235.8 lb Height: 74in Body Surface Area: 2.33 m Body Mass Index: 30.27 kg/m  Temp.: 98.62F(Temporal)  Pulse: 100 (Regular)  BP: 134/82 (Sitting,  Left Arm, Standard)      Physical Exam Rodman Key K. Johnnathan Hagemeister MD; 02/22/2016 5:06 PM)  The physical exam findings are as follows: Note:The right axillary incision remains intact. There is a large amount swelling underneath the incision. There is some deep erythema around the incision but this seems to be fairly well localized. This does extend towards his chest. It does not extend down his arm. There is no heat to this area.  We prepped the area with ChloraPrep and anesthetized with 1% lidocaine with epinephrine. I made a 2 cm incision in the middle of the scar. We dissected down into the subcutaneous tissue. This seems to be some old blood clot coming from this area but we did not evacuate a large amount of fluid. I did not feel that we could adequately explore the wound completely in the office. A dressing was applied. We will post him for surgery tomorrow.    Assessment & Plan Jonathan Hayes. Trish Mancinelli MD; 02/22/2016 3:35 PM)  MALIGNANT MELANOMA OF AXILLA (C43.59)  HEMATOMA OF AXILLA, RIGHT, SEQUELA (S40.021S)  Current Plans Schedule for Surgery - Incision and drainage of right axillary hematoma. The surgical procedure has been discussed with the patient. Potential risks, benefits, alternative treatments, and expected outcomes have been explained. All of the patient's questions at this time have been answered. The likelihood of reaching the patient's treatment goal is good. The patient understand the proposed surgical procedure and wishes to proceed.  Jonathan Hayes. Georgette Dover, MD, [facility]  General/ Trauma Surgery  02/22/2016 5:06 PM

## 2016-02-23 NOTE — Transfer of Care (Signed)
Immediate Anesthesia Transfer of Care Note  Patient: Jonathan Hayes  Procedure(s) Performed: Procedure(s): INCISION AND DRAINAGE OF RIGHT AXILLARY HEMATOMA (Right)  Patient Location: PACU  Anesthesia Type:General  Level of Consciousness: awake, alert , oriented and patient cooperative  Airway & Oxygen Therapy: Patient Spontanous Breathing and Patient connected to nasal cannula oxygen  Post-op Assessment: Report given to RN, Post -op Vital signs reviewed and stable and Patient moving all extremities X 4  Post vital signs: Reviewed and stable  Last Vitals:  Vitals:   02/23/16 0708 02/23/16 1000  BP: 128/81   Pulse: 99   Resp: 18   Temp: (!) 38 C (P) 37.2 C    Last Pain:  Vitals:   02/23/16 1000  TempSrc:   PainSc: (P) 0-No pain      Patients Stated Pain Goal: 2 ([id number])  Complications: No apparent anesthesia complications

## 2016-02-23 NOTE — Interval H&P Note (Signed)
History and Physical Interval Note:  02/23/2016 7:36 AM  Jonathan Hayes  has presented today for surgery, with the diagnosis of RIGHT AXILLARY HEMATOMA  The various methods of treatment have been discussed with the patient and family. After consideration of risks, benefits and other options for treatment, the patient has consented to  Procedure(s): INCISION AND DRAINAGE OF RIGHT AXILLARY HEMATOMA (Right) as a surgical intervention .  The patient's history has been reviewed, patient examined, no change in status, stable for surgery.  I have reviewed the patient's chart and labs.  Questions were answered to the patient's satisfaction.     Coraline Talwar K.

## 2016-02-23 NOTE — Anesthesia Postprocedure Evaluation (Signed)
Anesthesia Post Note  Patient: Jonathan Hayes  Procedure(s) Performed: Procedure(s) (LRB): INCISION AND DRAINAGE OF RIGHT AXILLARY HEMATOMA (Right)  Patient location during evaluation: PACU Anesthesia Type: General Level of consciousness: awake and alert Pain management: pain level controlled Vital Signs Assessment: post-procedure vital signs reviewed and stable Respiratory status: spontaneous breathing, nonlabored ventilation, respiratory function stable and patient connected to nasal cannula oxygen Cardiovascular status: blood pressure returned to baseline and stable Postop Assessment: no signs of nausea or vomiting Anesthetic complications: no    Last Vitals:  Vitals:   02/23/16 1031 02/23/16 1038  BP: 135/71 115/65  Pulse: 94 100  Resp: 19 20  Temp:      Last Pain:  Vitals:   02/23/16 1038  TempSrc:   PainSc: 0-No pain                 [name]

## 2016-02-23 NOTE — Anesthesia Procedure Notes (Signed)
Procedure Name: LMA Insertion Date/Time: 02/23/2016 9:19 AM Performed by: Carney Living Pre-anesthesia Checklist: Patient identified, Emergency Drugs available, Suction available, Patient being monitored and Timeout performed Patient Re-evaluated:Patient Re-evaluated prior to inductionOxygen Delivery Method: Circle system utilized Preoxygenation: Pre-oxygenation with 100% oxygen Intubation Type: IV induction LMA Size: 5.0 Number of attempts: 1 Placement Confirmation: positive ETCO2 and breath sounds checked- equal and bilateral Tube secured with: Tape Dental Injury: Teeth and Oropharynx as per pre-operative assessment

## 2016-02-23 NOTE — Op Note (Signed)
Pre-op Diagnosis:  Right axillary hematoma Post-op Diagnosis:  Right axillary seroma Procedure:  Incision and drainage of right axillary seroma Surgeon:  Donnie Mesa K. Anesthesia:  GEN- LMA Indications:  This is a 55 yo male who is status post right axillary lymph node dissection on 01/17/16. The patient had been doing well and history and was removed. Last week began developing increasing swelling and some erythema of the skin around the incision. This has become fairly large. Aspiration was attempted but was unsuccessful. He presents now for incision and drainage under anesthesia.  Description of procedure: The patient is brought to the operating room and placed in the supine position on the operating room table. After an adequate level of general anesthesia was obtained, his right axilla was prepped with Betadine and draped sterile fashion. The middle 2 cm of his previous incision or open yesterday in the office. We bluntly dissected into the base of this opening. We entered a very deep very large cavity containing some clear yellow seroma fluid. We went ahead and culture this and sent this to microbiology. We suctioned out all the seroma. The wound was opened for length of about 4 cm. Cautery was used for hemostasis. We irrigated the wound thoroughly. There is no purulence noted. There is some induration of the surrounding tissues. We placed a 1 inch Penrose drain deep into the depths of the seroma cavity. This was secured with 2-0 nylon. 2-0 nylon sutures were used to reapproximate decisively incision. A dry dressing was applied. The patient was then extubated and brought to the recovery room in stable condition. All sponge, instrument, and needle counts are correct.  Imogene Burn. Georgette Dover, MD, [facility]  General/ Trauma Surgery  02/23/2016 9:56 AM

## 2016-02-23 NOTE — Anesthesia Preprocedure Evaluation (Addendum)
Anesthesia Evaluation  Patient identified by MRN, date of birth, ID band Patient awake    Reviewed: Allergy & Precautions, NPO status , Patient's Chart, lab work & pertinent test results  Airway Mallampati: I  TM Distance: >3 FB Neck ROM: Full    Dental  (+) Teeth Intact, Dental Advisory Given   Pulmonary    Pulmonary exam normal        Cardiovascular Normal cardiovascular exam     Neuro/Psych    GI/Hepatic   Endo/Other    Renal/GU      Musculoskeletal   Abdominal   Peds  Hematology   Anesthesia Other Findings   Reproductive/Obstetrics                            Anesthesia Physical Anesthesia Plan  ASA: II  Anesthesia Plan: General   Post-op Pain Management:    Induction: Intravenous  Airway Management Planned: LMA  Additional Equipment:   Intra-op Plan:   Post-operative Plan: Extubation in OR  Informed Consent: I have reviewed the patients History and Physical, chart, labs and discussed the procedure including the risks, benefits and alternatives for the proposed anesthesia with the patient or authorized representative who has indicated his/her understanding and acceptance.   Dental advisory given  Plan Discussed with: CRNA, Surgeon and Anesthesiologist  Anesthesia Plan Comments:        Anesthesia Quick Evaluation

## 2016-02-24 ENCOUNTER — Encounter (HOSPITAL_COMMUNITY): Payer: Self-pay | Admitting: Surgery

## 2016-02-25 LAB — AEROBIC CULTURE W GRAM STAIN (SUPERFICIAL SPECIMEN)

## 2016-02-25 LAB — AEROBIC CULTURE  (SUPERFICIAL SPECIMEN)

## 2016-02-28 LAB — ANAEROBIC CULTURE

## 2016-03-15 ENCOUNTER — Telehealth: Payer: Self-pay | Admitting: *Deleted

## 2016-03-15 ENCOUNTER — Telehealth: Payer: Self-pay | Admitting: Oncology

## 2016-03-15 NOTE — Telephone Encounter (Signed)
Called patient to inform of fu for 03-29-16 @ 8:45 am, spoke with patient and he is aware of this appt.

## 2016-03-15 NOTE — Telephone Encounter (Signed)
Left a message for Jonathan Hayes to see if he is ready to start radiation.  Requested a return call.

## 2016-03-15 NOTE — Telephone Encounter (Signed)
Mitchal called back and said he thinks he will be ready to have his CT Simulation in 2 weeks.  He said his last surgery was 02/23/16 and that the wound has not fully healed yet.

## 2016-03-29 ENCOUNTER — Ambulatory Visit
Admission: RE | Admit: 2016-03-29 | Discharge: 2016-03-29 | Disposition: A | Discharge status: Home / Self Care | Payer: Self-pay | Source: Ambulatory Visit | Attending: Radiation Oncology | Admitting: Radiation Oncology

## 2016-03-29 ENCOUNTER — Encounter: Payer: Self-pay | Admitting: Radiation Oncology

## 2016-03-29 VITALS — BP 128/87 | HR 53 | Temp 98.0°F | Ht 73.0 in | Wt 241.4 lb

## 2016-03-29 DIAGNOSIS — C4359 Malignant melanoma of other part of trunk: Secondary | ICD-10-CM

## 2016-03-29 DIAGNOSIS — Z9889 Other specified postprocedural states: Secondary | ICD-10-CM | POA: Insufficient documentation

## 2016-03-29 DIAGNOSIS — Z79899 Other long term (current) drug therapy: Secondary | ICD-10-CM | POA: Insufficient documentation

## 2016-03-29 NOTE — Progress Notes (Signed)
  Radiation Oncology         [phone] ________________________________  Name: Jonathan Hayes MRN: [id number]  Date: 03/29/2016  DOB: 1960-08-09  Follow-Up Visit Note  CC: Iverson Alamin, MD  Donnie Mesa, MD    ICD-9-CM ICD-10-CM   1. Malignant melanoma of axilla ([place]) 172.5 C43.59     Diagnosis:     ICD-9-CM ICD-10-CM   1. 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 Since follow-up on 02/09/16, patient had a wound infection. He proceeded to have an incision and drainage of right axillary hematoma on 02/23/16 by Dr. Georgette Dover.   Review of symptoms: The patient denies pain. He reports a tingling in his right chest. He denies having any drainage of the area except for when he exercises.   ALLERGIES:  is allergic to no known allergies.  Meds: Current Outpatient Prescriptions  Medication Sig Dispense Refill  . acetaminophen (TYLENOL) 325 MG tablet Take 650 mg by mouth every 6 (six) hours as needed for mild pain.    Marland Kitchen ibuprofen (ADVIL,MOTRIN) 200 MG tablet Take 200 mg by mouth every 6 (six) hours as needed.    . Multiple Vitamin (MULTIVITAMIN) tablet Take 1 tablet by mouth daily.    Marland Kitchen oxyCODONE (OXY IR/ROXICODONE) 5 MG immediate release tablet Take 1-2 tablets (5-10 mg total) by mouth every 6 (six) hours as needed for severe pain. (Patient not taking: Reported on 03/29/2016) 30 tablet 0  . oxyCODONE-acetaminophen (PERCOCET/ROXICET) 5-325 MG tablet Take by mouth every 4 (four) hours as needed for severe pain.     No current facility-administered medications for this encounter.     Physical Findings: The patient is in no acute distress. Patient is alert and oriented.  height is 6\' 1"  (1.854 m) and weight is 241 lb 6.4 oz (109.5 kg). His oral temperature is 98 F (36.7 C). His blood pressure is 128/87 and his pulse is 53 (abnormal).  His oxygen saturation is 98%.   ~ 2 cm area that is open along the surgical bed, but does not appear to be deep, very superficial. No signs of infection in the axilla and no significant swelling.  Lab Findings: Lab Results  Component Value Date   WBC 8.2 02/23/2016   HGB 14.0 02/23/2016   HCT 40.5 02/23/2016   MCV 85.8 02/23/2016   PLT 226 02/23/2016    Radiographic Findings: No results found.  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 He still has a little separation of his scar but this should heal in 7-10 days. He will see his surgeon Dr. Georgette Dover. He will begin treatment on 04/09/16 after he has received clearance from his surgeon.  Plan: Patient will proceed to simulation later this morning and tentatively start treatment on 04/09/16. ____________________________________ -----------------------------------  Blair Promise, PhD, MD  This document serves as a record of services personally performed by Gery Pray, MD. It was created on his behalf by Bethann Humble, a trained medical scribe. The creation of this record is based on the scribe's personal observations and the provider's statements to them. This document has been checked and approved by the attending provider.

## 2016-03-29 NOTE — Progress Notes (Addendum)
Jonathan Hayes here for follow up.  He denies having pain but does report having some tingling in his right chest.  He denies having any drainage of the area except for when he exercises.  The area is slightly swollen.  The scar is still partially open with clear drainage present.   BP 128/87 (BP Location: Left Arm, Patient Position: Sitting)   Pulse (!) 53   Temp 98 F (36.7 C) (Oral)   Ht 6\' 1"  (1.854 m)   Wt 241 lb 6.4 oz (109.5 kg)   SpO2 98%   BMI 31.85 kg/m    Wt Readings from Last 3 Encounters:  03/29/16 241 lb 6.4 oz (109.5 kg)  02/23/16 236 lb (107 kg)  02/09/16 236 lb 9.6 oz (107.3 kg)

## 2016-04-09 ENCOUNTER — Ambulatory Visit: Payer: Self-pay | Admitting: Radiation Oncology

## 2016-04-09 ENCOUNTER — Telehealth: Payer: Self-pay | Admitting: Oncology

## 2016-04-09 NOTE — Telephone Encounter (Signed)
Called [name]'s wife, Jonathan Hayes, to make sure Jonathan Hayes is aware that his radiation is canceled today.  Dawn said he is aware and will see Dr. Georgette Dover on 04/16/16 to recheck his skin.

## 2016-04-09 NOTE — Telephone Encounter (Signed)
Called Jonathan Hayes and left a him a message advising him that radiation will not start today due to his skin needing more time to heal.  Requested a return call.

## 2016-04-10 ENCOUNTER — Ambulatory Visit: Payer: Self-pay | Admitting: Radiation Oncology

## 2016-04-10 ENCOUNTER — Ambulatory Visit: Payer: Self-pay

## 2016-04-11 ENCOUNTER — Ambulatory Visit: Payer: Self-pay

## 2016-04-13 ENCOUNTER — Ambulatory Visit: Payer: Self-pay

## 2016-04-16 ENCOUNTER — Telehealth: Payer: Self-pay | Admitting: Oncology

## 2016-04-16 ENCOUNTER — Ambulatory Visit: Payer: Self-pay

## 2016-04-16 NOTE — Telephone Encounter (Signed)
Tsering said he will be seeing Dr. Georgette Dover at 10 am today.  He said the scar area is closed but he does not feel like the area is healed.  He will call after his appointment with Dr. Georgette Dover to let us know if he can start today.  Notified Heather, RT on Lin[facility] 1.

## 2016-04-17 ENCOUNTER — Ambulatory Visit: Payer: Self-pay

## 2016-04-17 ENCOUNTER — Ambulatory Visit: Admission: RE | Admit: 2016-04-17 | Payer: Self-pay | Source: Ambulatory Visit | Admitting: Radiation Oncology

## 2016-04-17 ENCOUNTER — Telehealth: Payer: Self-pay | Admitting: Oncology

## 2016-04-17 NOTE — Telephone Encounter (Signed)
Called Joskar about his start date for radiation.  He said he is supposed to start on Wednesday.  Notified Anna, RT on Linac 1.

## 2016-04-18 ENCOUNTER — Ambulatory Visit
Admission: RE | Admit: 2016-04-18 | Discharge: 2016-04-18 | Disposition: A | Discharge status: Home / Self Care | Payer: Self-pay | Source: Ambulatory Visit | Attending: Radiation Oncology | Admitting: Radiation Oncology

## 2016-04-18 NOTE — Addendum Note (Signed)
Encounter addended by: Jacqulyn Liner, RN on: 04/18/2016  8:37 AM<BR>    Actions taken: Charge Capture section accepted

## 2016-04-19 ENCOUNTER — Ambulatory Visit
Admission: RE | Admit: 2016-04-19 | Discharge: 2016-04-19 | Disposition: A | Discharge status: Home / Self Care | Payer: Self-pay | Source: Ambulatory Visit | Attending: Radiation Oncology | Admitting: Radiation Oncology

## 2016-04-20 ENCOUNTER — Ambulatory Visit
Admission: RE | Admit: 2016-04-20 | Discharge: 2016-04-20 | Disposition: A | Discharge status: Home / Self Care | Payer: Self-pay | Source: Ambulatory Visit | Attending: Radiation Oncology | Admitting: Radiation Oncology

## 2016-04-23 ENCOUNTER — Ambulatory Visit
Admission: RE | Admit: 2016-04-23 | Discharge: 2016-04-23 | Disposition: A | Discharge status: Home / Self Care | Payer: Self-pay | Source: Ambulatory Visit | Attending: Radiation Oncology | Admitting: Radiation Oncology

## 2016-04-24 ENCOUNTER — Ambulatory Visit
Admission: RE | Admit: 2016-04-24 | Discharge: 2016-04-24 | Disposition: A | Discharge status: Home / Self Care | Payer: Self-pay | Source: Ambulatory Visit | Attending: Radiation Oncology | Admitting: Radiation Oncology

## 2016-04-24 ENCOUNTER — Encounter: Payer: Self-pay | Admitting: Radiation Oncology

## 2016-04-24 VITALS — BP 140/100 | HR 67 | Temp 98.1°F | Ht 73.0 in | Wt 241.4 lb

## 2016-04-24 DIAGNOSIS — C4359 Malignant melanoma of other part of trunk: Secondary | ICD-10-CM

## 2016-04-24 MED ORDER — ALRA NON-METALLIC DEODORANT (RAD-ONC)
1.0000 "application " | Freq: Once | TOPICAL | Status: AC
  Administered 2016-04-24: 1 via TOPICAL

## 2016-04-24 MED ORDER — RADIAPLEXRX EX GEL
Freq: Once | CUTANEOUS | Status: AC
  Administered 2016-04-24: 10:00:00 via TOPICAL

## 2016-04-24 NOTE — Progress Notes (Signed)
Jonathan Hayes has completed 5 fractions to his right axilla.  He denies having pain.  He has noticed a metallic taste in his mouth and rectal irritation.  He said he also had these side effects while taking Keytruda.  The skin on on his right axilla area is red.  He reports having fatigue.  BP (!) 140/100 (BP Location: Left Arm, Patient Position: Sitting)   Pulse 67   Temp 98.1 F (36.7 C) (Oral)   Ht 6\' 1"  (1.854 m)   Wt 241 lb 6.4 oz (109.5 kg)   SpO2 99%   BMI 31.85 kg/m    Wt Readings from Last 3 Encounters:  04/24/16 241 lb 6.4 oz (109.5 kg)  03/29/16 241 lb 6.4 oz (109.5 kg)  02/23/16 236 lb (107 kg)

## 2016-04-24 NOTE — Progress Notes (Signed)
Pt here for patient teaching.  Pt given Radiation and You booklet, Alra deodorant and Radiaplex gel.  Reviewed areas of pertinence such as fatigue and skin changes . Pt able to give teach back of to pat skin and use unscented/gentle soap,apply Radiaplex bid and avoid applying anything to skin within 4 hours of treatment. Pt demonstrated understanding and verbalizes understanding of information given and will contact nursing with any questions or concerns.       

## 2016-04-24 NOTE — Progress Notes (Signed)
  Radiation Oncology         [phone] ________________________________  Name: Jonathan Hayes MRN: [id number]  Date: 04/24/2016  DOB: 1960/06/12  Weekly Radiation Therapy Management    ICD-9-CM ICD-10-CM   1. Malignant melanoma of axilla ([facility]) 172.5 C43.59      Current Dose: 12 Gy     Planned Dose:  48 Gy  Narrative . . . . . . . . The patient presents for routine under treatment assessment.  The patient has completed 5 fractions to his right axilla. He denies having pain. He has noticed a metallic taste in his mouth. The patient reports recent rectal irritation. He reports he also has these side effects while taking Keytruda. He reports fatigue. The patient says yesterday was a "bad day mentally," and he considered stopping treatment, but "today feels okay."                                   The patient is without complaint.                                 Set-up films were reviewed.                                 The chart was checked. Physical Findings. . .  height is 6\' 1"  (1.854 m) and weight is 241 lb 6.4 oz (109.5 kg). His oral temperature is 98.1 F (36.7 C). His blood pressure is 140/100 (abnormal) and his pulse is 67. His oxygen saturation is 99%. . Weight essentially stable.  No significant changes. Cardiopulmonary assessment is negative for acute distress and he exhibits normal effort. No appreciable skin reaction in the treatment area. No evidence of thrush. Impression . . . . . . . The patient is tolerating radiation. Plan . . . . . . . . . . . . Continue treatment as planned. I recommend the patient try Preparation H or another hemorrhoid cream; if this does not help I will prescribe another medication such as steroid suppository for his rectal discomfort. ________________________________   Blair Promise, PhD, MD  This document serves as a record of services personally performed by Gery Pray, MD. It was created on his behalf by Maryla Morrow, a trained medical  scribe. The creation of this record is based on the scribe's personal observations and the provider's statements to them. This document has been checked and approved by the attending provider.

## 2016-04-25 ENCOUNTER — Ambulatory Visit
Admission: RE | Admit: 2016-04-25 | Discharge: 2016-04-25 | Disposition: A | Discharge status: Home / Self Care | Payer: Self-pay | Source: Ambulatory Visit | Attending: Radiation Oncology | Admitting: Radiation Oncology

## 2016-04-26 ENCOUNTER — Ambulatory Visit
Admission: RE | Admit: 2016-04-26 | Discharge: 2016-04-26 | Disposition: A | Discharge status: Home / Self Care | Payer: Self-pay | Source: Ambulatory Visit | Attending: Radiation Oncology | Admitting: Radiation Oncology

## 2016-04-27 ENCOUNTER — Ambulatory Visit
Admission: RE | Admit: 2016-04-27 | Discharge: 2016-04-27 | Disposition: A | Discharge status: Home / Self Care | Payer: Self-pay | Source: Ambulatory Visit | Attending: Radiation Oncology | Admitting: Radiation Oncology

## 2016-04-30 ENCOUNTER — Ambulatory Visit
Admission: RE | Admit: 2016-04-30 | Discharge: 2016-04-30 | Disposition: A | Discharge status: Home / Self Care | Payer: Self-pay | Source: Ambulatory Visit | Attending: Radiation Oncology | Admitting: Radiation Oncology

## 2016-05-01 ENCOUNTER — Ambulatory Visit
Admission: RE | Admit: 2016-05-01 | Discharge: 2016-05-01 | Disposition: A | Discharge status: Home / Self Care | Payer: Self-pay | Source: Ambulatory Visit | Attending: Radiation Oncology | Admitting: Radiation Oncology

## 2016-05-01 ENCOUNTER — Encounter: Payer: Self-pay | Admitting: Radiation Oncology

## 2016-05-01 ENCOUNTER — Ambulatory Visit: Payer: Self-pay | Admitting: Radiation Oncology

## 2016-05-01 VITALS — BP 136/88 | HR 62 | Temp 98.2°F | Ht 73.0 in | Wt 240.2 lb

## 2016-05-01 DIAGNOSIS — C4359 Malignant melanoma of other part of trunk: Secondary | ICD-10-CM

## 2016-05-01 NOTE — Progress Notes (Signed)
  Radiation Oncology         [phone] ________________________________  Name: Jonathan Hayes MRN: [id number]  Date: 05/01/2016  DOB: 08/05/1960  Weekly Radiation Therapy Management    ICD-9-CM ICD-10-CM   1. Malignant melanoma of axilla ([facility]) 172.5 C43.59      Current Dose: 24 Gy     Planned Dose:  48 Gy  Narrative . . . . . . . . The patient presents for routine under treatment assessment.  The patient has completed 10 fractions to his right axilla. He denies having pain. Slightly tired. Slight erythema over right axilla. Set-up films were reviewed.                                 The chart was checked.  Physical Findings. . .  height is 6\' 1"  (1.854 m) and weight is 240 lb 3.2 oz (109 kg). His oral temperature is 98.2 F (36.8 C). His blood pressure is 136/88 and his pulse is 62. His oxygen saturation is 99%. .  Slight erythema, right axilla, skin intact.  Impression . . . . . . . The patient is tolerating radiation.  Plan . . . . . . . . . . . . Continue treatment as planned.  Apply radiaplex to skin in RT fields.  -----------------------------------  Eppie Gibson, MD

## 2016-05-01 NOTE — Progress Notes (Signed)
Jonathan Hayes has completed 10 fractions to his right axilla.  He denies having pain.  He reports having slight fatigue. The skin on his right axilla is pink.  He has used radiaplex once.  Advised him to use it twice a day.  BP 136/88 (BP Location: Left Arm, Patient Position: Sitting)   Pulse 62   Temp 98.2 F (36.8 C) (Oral)   Ht 6\' 1"  (1.854 m)   Wt 240 lb 3.2 oz (109 kg)   SpO2 99%   BMI 31.69 kg/m    Wt Readings from Last 3 Encounters:  05/01/16 240 lb 3.2 oz (109 kg)  04/24/16 241 lb 6.4 oz (109.5 kg)  03/29/16 241 lb 6.4 oz (109.5 kg)

## 2016-05-02 ENCOUNTER — Ambulatory Visit: Payer: Self-pay | Admitting: Radiation Oncology

## 2016-05-02 ENCOUNTER — Ambulatory Visit
Admission: RE | Admit: 2016-05-02 | Discharge: 2016-05-02 | Disposition: A | Discharge status: Home / Self Care | Payer: Self-pay | Source: Ambulatory Visit | Attending: Radiation Oncology | Admitting: Radiation Oncology

## 2016-05-03 ENCOUNTER — Ambulatory Visit
Admission: RE | Admit: 2016-05-03 | Discharge: 2016-05-03 | Disposition: A | Discharge status: Home / Self Care | Payer: Self-pay | Source: Ambulatory Visit | Attending: Radiation Oncology | Admitting: Radiation Oncology

## 2016-05-04 ENCOUNTER — Ambulatory Visit
Admission: RE | Admit: 2016-05-04 | Discharge: 2016-05-04 | Disposition: A | Discharge status: Home / Self Care | Payer: Self-pay | Source: Ambulatory Visit | Attending: Radiation Oncology | Admitting: Radiation Oncology

## 2016-05-07 ENCOUNTER — Ambulatory Visit
Admission: RE | Admit: 2016-05-07 | Discharge: 2016-05-07 | Disposition: A | Discharge status: Home / Self Care | Payer: Self-pay | Source: Ambulatory Visit | Attending: Radiation Oncology | Admitting: Radiation Oncology

## 2016-05-08 ENCOUNTER — Encounter: Payer: Self-pay | Admitting: Radiation Oncology

## 2016-05-08 ENCOUNTER — Ambulatory Visit
Admission: RE | Admit: 2016-05-08 | Discharge: 2016-05-08 | Disposition: A | Discharge status: Home / Self Care | Payer: Self-pay | Source: Ambulatory Visit | Attending: Radiation Oncology | Admitting: Radiation Oncology

## 2016-05-08 VITALS — BP 142/83 | HR 68 | Temp 97.7°F | Resp 20 | Wt 244.0 lb

## 2016-05-08 DIAGNOSIS — C4359 Malignant melanoma of other part of trunk: Secondary | ICD-10-CM

## 2016-05-08 MED ORDER — RANITIDINE HCL 150 MG PO TABS: 150.0000 mg | ORAL_TABLET | Freq: Two times a day (BID) | ORAL | 0 refills | Status: AC

## 2016-05-08 NOTE — Progress Notes (Signed)
  Radiation Oncology         [phone] ________________________________  Name: Jonathan Hayes MRN: [id number]  Date: 05/08/2016  DOB: 04/04/1961  Weekly Radiation Therapy Management    ICD-9-CM ICD-10-CM   1. Malignant melanoma of axilla ([facility]) 172.5 C43.59 ranitidine (ZANTAC) 150 MG tablet     Current Dose: 36Gy     Planned Dose:  48 Gy  Narrative . . . . . . . . The patient presents for routine under treatment assessment.  The patient has completed 15 fractions to his right axilla. He denies having pain. Slightly tired. Main issue is bothersome heart burn.  The chart was checked.  Physical Findings. . .  weight is 244 lb (110.7 kg). His oral temperature is 97.7 F (36.5 C). His blood pressure is 142/83 (abnormal) and his pulse is 68. His respiration is 20. .  Bright erythema, right axilla, skin intact.  Impression . . . . . . . The patient is tolerating radiation.  Plan . . . . . . . . . . . . Continue treatment as planned.  Apply radiaplex to skin in RT fields. Apply hydrocortisone 1% cream prn itching and neosporin if moist peeling occurs. Rx Ranitidine today for heartburn. Encouragement given today.  -----------------------------------  Eppie Gibson, MD This document serves as a record of services personally performed by Eppie Gibson, MD. It was created on her behalf by Bethann Humble, a trained medical scribe. The creation of this record is based on the scribe's personal observations and the provider's statements to them. This document has been checked and approved by the attending provider.

## 2016-05-08 NOTE — Progress Notes (Signed)
Weekly rad tx right axilla,15/20 completed, erythema,a,. Rash on top of right shoulder area,uses radiaplec 2-3x day, main c/o heart burn appetie good, getting fatigued in afternoon now 8:47 AM BP (!) 142/83 (BP Location: Left Arm, Patient Position: Sitting, Cuff Size: Normal)   Pulse 68   Temp 97.7 F (36.5 C) (Oral)   Resp 20   Wt 244 lb (110.7 kg)   BMI 32.19 kg/m   Wt Readings from Last 3 Encounters:  05/08/16 244 lb (110.7 kg)  05/01/16 240 lb 3.2 oz (109 kg)  04/24/16 241 lb 6.4 oz (109.5 kg)

## 2016-05-09 ENCOUNTER — Ambulatory Visit
Admission: RE | Admit: 2016-05-09 | Discharge: 2016-05-09 | Disposition: A | Discharge status: Home / Self Care | Payer: Self-pay | Source: Ambulatory Visit | Attending: Radiation Oncology | Admitting: Radiation Oncology

## 2016-05-09 DIAGNOSIS — Z510 Encounter for antineoplastic radiation therapy: Secondary | ICD-10-CM | POA: Insufficient documentation

## 2016-05-09 DIAGNOSIS — C4359 Malignant melanoma of other part of trunk: Secondary | ICD-10-CM | POA: Insufficient documentation

## 2016-05-10 ENCOUNTER — Ambulatory Visit
Admission: RE | Admit: 2016-05-10 | Discharge: 2016-05-10 | Disposition: A | Discharge status: Home / Self Care | Payer: Self-pay | Source: Ambulatory Visit | Attending: Radiation Oncology | Admitting: Radiation Oncology

## 2016-05-11 ENCOUNTER — Ambulatory Visit
Admission: RE | Admit: 2016-05-11 | Discharge: 2016-05-11 | Disposition: A | Discharge status: Home / Self Care | Payer: Self-pay | Source: Ambulatory Visit | Attending: Radiation Oncology | Admitting: Radiation Oncology

## 2016-05-15 ENCOUNTER — Ambulatory Visit
Admission: RE | Admit: 2016-05-15 | Discharge: 2016-05-15 | Disposition: A | Discharge status: Home / Self Care | Payer: Self-pay | Source: Ambulatory Visit | Attending: Radiation Oncology | Admitting: Radiation Oncology

## 2016-05-15 ENCOUNTER — Telehealth: Payer: Self-pay | Admitting: *Deleted

## 2016-05-15 VITALS — BP 130/86 | HR 66 | Resp 18 | Wt 247.4 lb

## 2016-05-15 DIAGNOSIS — C4359 Malignant melanoma of other part of trunk: Secondary | ICD-10-CM

## 2016-05-15 NOTE — Telephone Encounter (Signed)
CALLED PATIENT TO INFORM OF FU WITH DR. [place] ON 07-05-16 @ 8:30 AM, SPOKE WITH PATIENT AND HE IS AWARE OF THIS APPT.

## 2016-05-15 NOTE — Progress Notes (Signed)
Weight and vitals stable. Denies pain. No evidence of lymphedema noted. Desquamation with dermatitis of right axilla, right chest wall and right upper back. Reports moderate fatigue. Provided patient with Biafine directed upon use.   BP 130/86 (BP Location: Left Arm, Patient Position: Sitting, Cuff Size: Normal)   Pulse 66   Resp 18   Wt 247 lb 6.4 oz (112.2 kg)   SpO2 100%   BMI 32.64 kg/m  Wt Readings from Last 3 Encounters:  05/15/16 247 lb 6.4 oz (112.2 kg)  05/08/16 244 lb (110.7 kg)  05/01/16 240 lb 3.2 oz (109 kg)

## 2016-05-15 NOTE — Progress Notes (Signed)
   Weekly Management Note:  Outpatient    ICD-9-CM ICD-10-CM   1. Malignant melanoma of axilla ([facility]) 172.5 C43.59     Current Dose:  45.6 Gy  Projected Dose: 48 Gy   Narrative:  The patient presents for routine under treatment assessment.  CBCT/MVCT images/Port film x-rays were reviewed.  The chart was checked.   Weight and vitals stable. Denies pain. No evidence of lymphedema noted. Nursing notes desquamation with dermatitis of right axilla, right chest wall and right upper back. Reports moderate fatigue. Patient was provided with Biafine and directed upon use by nursing. He admits to scratching the treatment area. The patient asked about follow up plans and surveillance scans after treatment completion.   Physical Findings:  weight is 247 lb 6.4 oz (112.2 kg). His blood pressure is 130/86 and his pulse is 66. His respiration is 18 and oxygen saturation is 100%.   Wt Readings from Last 3 Encounters:  05/15/16 247 lb 6.4 oz (112.2 kg)  05/08/16 244 lb (110.7 kg)  05/01/16 240 lb 3.2 oz (109 kg)   Diffuse erythema and dryness with a radiation dermatitis rash over the right chest and right upper back.  Impression:  The patient is tolerating radiotherapy.  Plan:  Continue radiotherapy as planned. I encouraged the patient to begin using 1% cortisone cream on itchy skin to avoid scratching. He will use Biafine as directed. He will complete treatment soon and will return for follow up in 1 month with Dr. Sondra Come.  ________________________________   Eppie Gibson, M.D.   This document serves as a record of services personally performed by Eppie Gibson, MD. It was created on her behalf by Arlyce Harman, a trained medical scribe. The creation of this record is based on the scribe's personal observations and the provider's statements to them. This document has been checked and approved by the attending provider.

## 2016-05-16 ENCOUNTER — Ambulatory Visit
Admission: RE | Admit: 2016-05-16 | Discharge: 2016-05-16 | Disposition: A | Discharge status: Home / Self Care | Payer: Self-pay | Source: Ambulatory Visit | Attending: Radiation Oncology | Admitting: Radiation Oncology

## 2016-05-17 ENCOUNTER — Ambulatory Visit: Payer: Self-pay

## 2016-05-18 ENCOUNTER — Ambulatory Visit: Payer: Self-pay

## 2016-05-22 ENCOUNTER — Ambulatory Visit: Payer: Self-pay

## 2016-05-23 ENCOUNTER — Ambulatory Visit: Payer: Self-pay

## 2016-05-23 ENCOUNTER — Encounter: Payer: Self-pay | Admitting: Radiation Oncology

## 2016-05-23 NOTE — Progress Notes (Signed)
  Radiation Oncology         [phone] ________________________________  Name: Jonathan Hayes MRN: [id number]  Date: 05/23/2016  DOB: Sep 05, 1960  End of Treatment Note  Diagnosis:   Malignant melanoma of axilla    Indication for treatment:  Curative, postop, status post neoadjuvant Keytruda     Radiation treatment dates:   04/18/16-05/16/16  Site/dose:   Right axilla/ 48 Gy in 20 fractions  Beams/energy:   3D / 15X, 6X  Narrative: The patient tolerated radiation treatment relatively well. During treatment, patient presented with desquamation with dermatitis of right axilla, right chest wall, and right upper back. No moist desquamation at the completion of treatment.  Plan: The patient has completed radiation treatment. The patient will return to radiation oncology clinic for routine followup in one month. I advised them to call or return sooner if they have any questions or concerns related to their recovery or treatment.  -----------------------------------  Blair Promise, PhD, MD  This document serves as a record of services personally performed by Gery Pray, MD. It was created on his behalf by Bethann Humble, a trained medical scribe. The creation of this record is based on the scribe's personal observations and the provider's statements to them. This document has been checked and approved by the attending provider.

## 2016-05-24 ENCOUNTER — Ambulatory Visit: Payer: Self-pay

## 2016-05-25 ENCOUNTER — Ambulatory Visit: Payer: Self-pay

## 2016-05-28 ENCOUNTER — Ambulatory Visit: Payer: Self-pay

## 2016-05-29 ENCOUNTER — Ambulatory Visit: Payer: Self-pay

## 2016-07-04 ENCOUNTER — Encounter: Payer: Self-pay | Admitting: Oncology

## 2016-07-05 ENCOUNTER — Ambulatory Visit
Admission: RE | Admit: 2016-07-05 | Discharge: 2016-07-05 | Disposition: A | Discharge status: Home / Self Care | Payer: PRIVATE HEALTH INSURANCE | Source: Ambulatory Visit | Attending: Radiation Oncology | Admitting: Radiation Oncology

## 2017-05-15 IMAGING — PT NM PET IMAGE RESTAGE (PS) WHOLE BODY
8 series · 25 of 25 positions shown · non-contrast
Comparison: CT chest 12/12/2015, PET 08/15/2015 and CT chest
07/22/2015.

CLINICAL DATA: Subsequent treatment strategy for melanoma.

EXAM:
NUCLEAR MEDICINE PET WHOLE BODY
TECHNIQUE: 11.8 mCi F-18 FDG was injected intravenously. Full-ring PET imaging
was performed from the vertex to the feet after the radiotracer. CT
data was obtained and used for attenuation correction and anatomic
localization.
FASTING BLOOD GLUCOSE:  Value:  105 mg/dl

[Series 3: ct wb 5.0 hd_fov · axial · 5.0mm · 1.22mm/px · z∈[-684,+1328]mm · 5 of 503 slices shown]
[im 1/503  full-range]
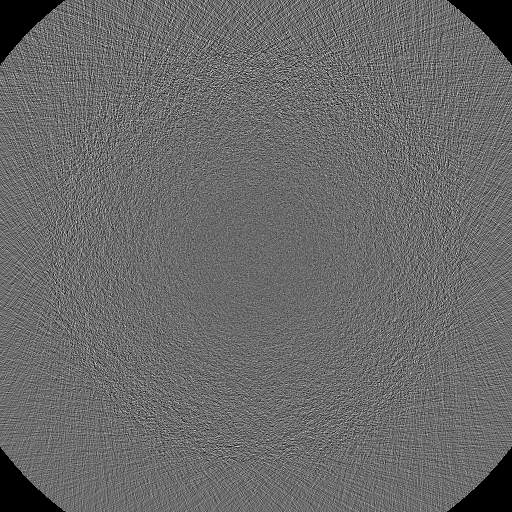
[im 126/503  soft-tissue]
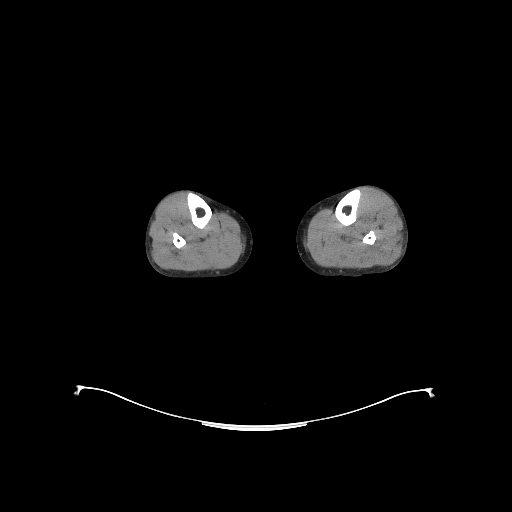
[im 252/503  soft-tissue]
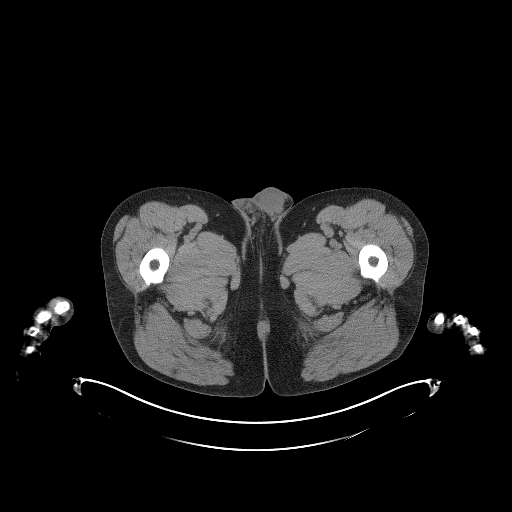
[im 377/503  soft-tissue]
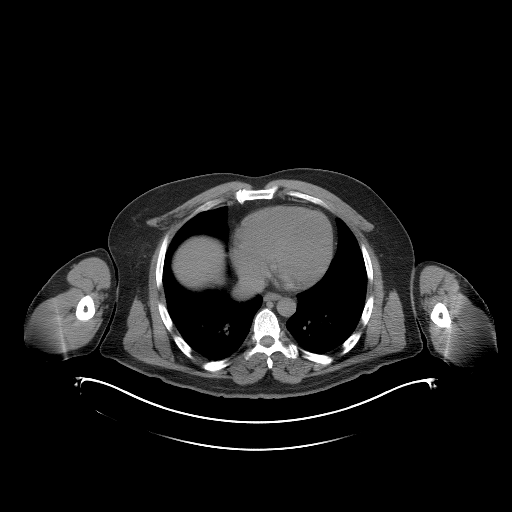
[im 503/503  soft-tissue]
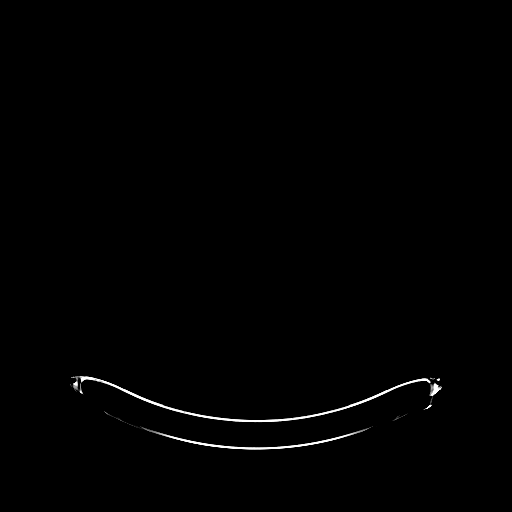

[Series 4: pet wb ac · axial · 5.0mm · 4.07mm/px · z∈[-684,+1328]mm · 5 of 504 slices shown]
[im 1/504]
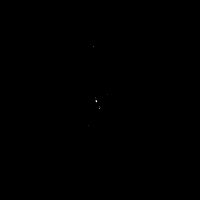
[im 126/504]
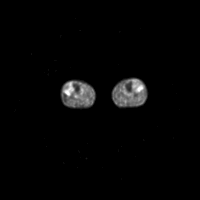
[im 252/504]
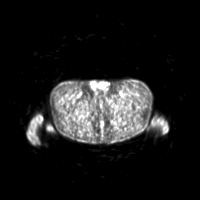
[im 378/504]
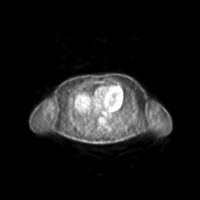
[im 504/504]
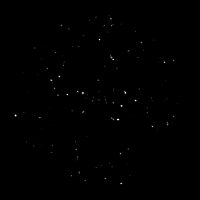

[Series 7: pet wb nac · axial · 5.0mm · 4.07mm/px · z∈[-676,+1328]mm · 6 of 502 slices shown]
[im 1/502]
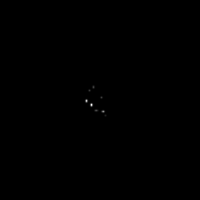
[im 101/502]
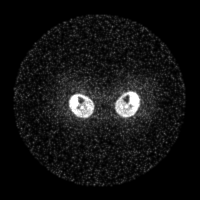
[im 201/502]
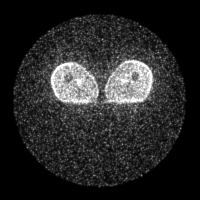
[im 301/502]
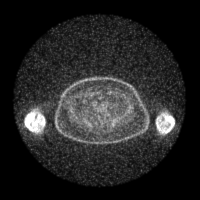
[im 401/502]
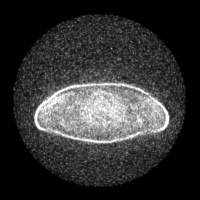
[im 502/502]
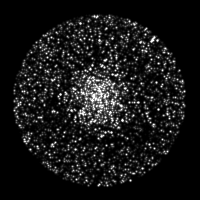

[Series 8: ct wb 5.0 b70f lung_(id) · axial · 5.0mm · 0.98mm/px · 1 of 77 slices shown]
[im 1/77  lung]
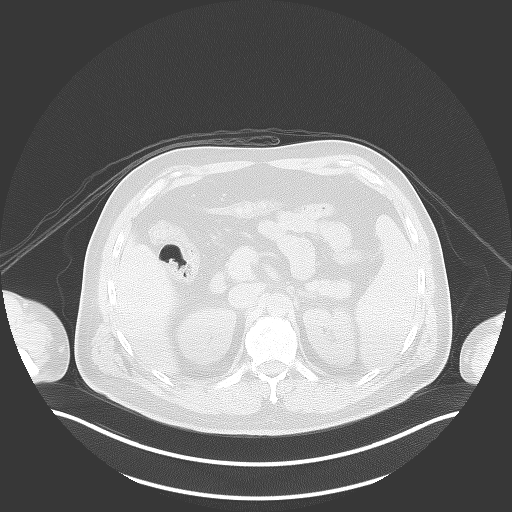

[Series 604: mip collection · coronal · 4.17mm/px · 1 of 32 slices shown]
[im 1/32]
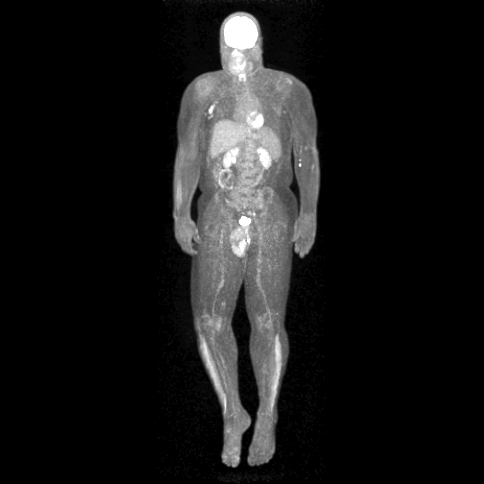

[Series 605: range-ct wb 5.0 hd_fov-cor-<alpha range> · 1 of 71 slices shown]
[im 1/71]
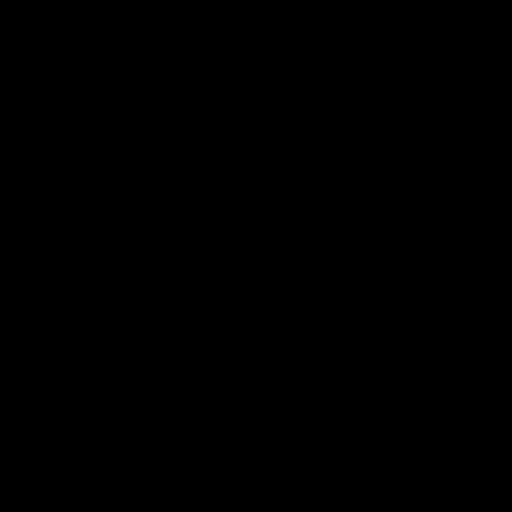

[Series 606: range-ct wb 5.0 hd_fov-tra-<alpha range> · 5 of 458 slices shown]
[im 1/458]
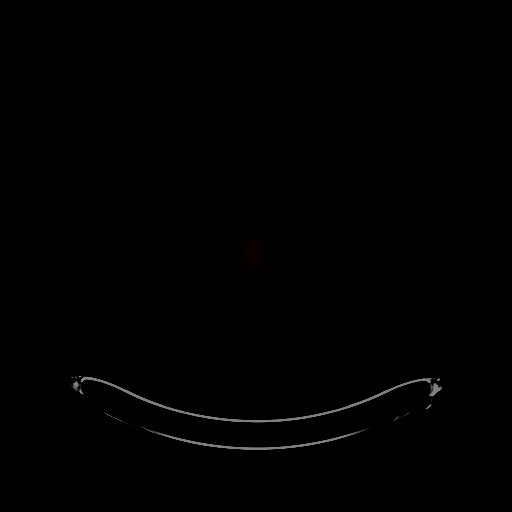
[im 115/458]
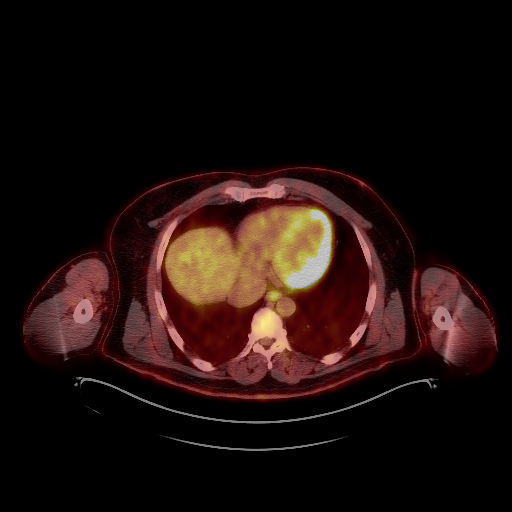
[im 229/458]
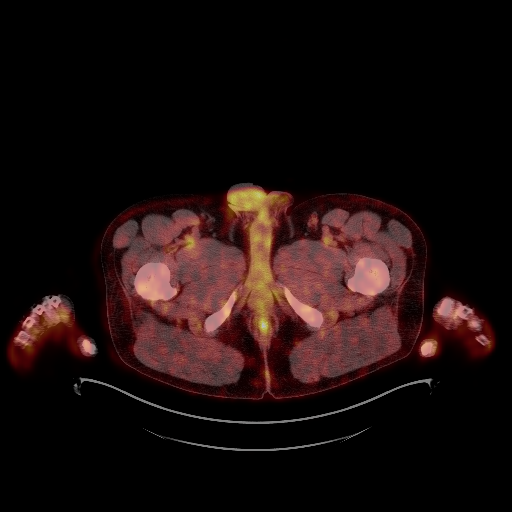
[im 343/458]
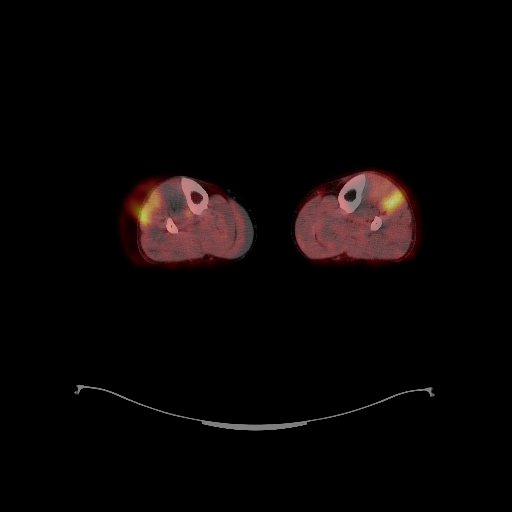
[im 458/458]
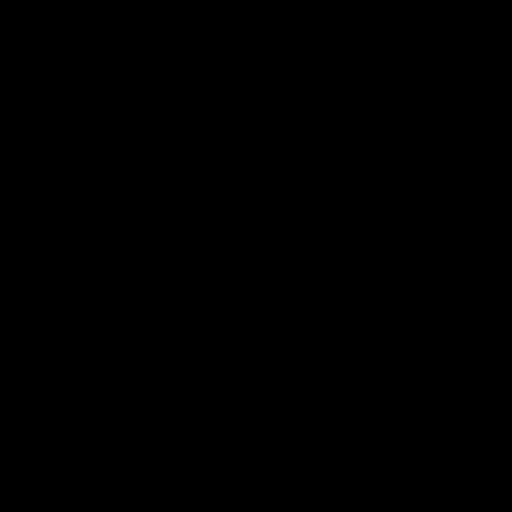

[Series 1075: results mm oncology reading · 1.27mm/px · 1 of 5 slices shown]
[im 1/5]
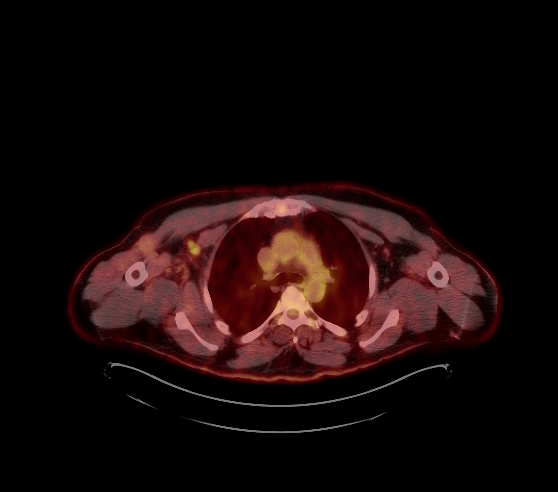

[25 of 25 positions shown; findings below may reference images not displayed]

FINDINGS: Head/Neck: No hypermetabolic lymph nodes in the neck. CT images show
no acute findings.

Chest: Hypermetabolic right subpectoral and right axillary lymph
nodes have decreased in size. Largest lymph node measures 1.3 x
cm in the right axilla with an SUV max of 8.3, compared to 5.1 x
cm and SUV max of 29.3 on 08/15/2015. No hypermetabolic mediastinal,
hilar or left axillary lymph nodes. No hypermetabolic pulmonary
nodules. CT images show no pericardial or pleural effusion.

Abdomen/Pelvis: No abnormal hypermetabolism in the liver, adrenal
glands, spleen or pancreas. No hypermetabolic lymph nodes.
Low-attenuation lesions in the liver measure up to 10 mm in the left
hepatic lobe, as before. Liver, gallbladder, adrenal glands,
kidneys, spleen, pancreas, stomach and bowel are otherwise grossly
unremarkable.

Skeleton: No abnormal osseous hypermetabolism.

Extremities: No hypermetabolic activity to suggest metastasis.
IMPRESSION: Interval response to therapy as evidenced by decrease in size and
hypermetabolism involving right subpectoral/ axillary lymph nodes,
without complete resolution.
